# Patient Record
Sex: Female | Born: 1960 | Race: White | Hispanic: No | Marital: Married | Smoking: Never smoker
Health system: Southern US, Community
[De-identification: ages and names within clinical notes are randomized; demographics above are authoritative.]

## PROBLEM LIST (undated history)

## (undated) DIAGNOSIS — K769 Liver disease, unspecified: Secondary | ICD-10-CM

## (undated) DIAGNOSIS — E079 Disorder of thyroid, unspecified: Secondary | ICD-10-CM

## (undated) DIAGNOSIS — E559 Vitamin D deficiency, unspecified: Secondary | ICD-10-CM

## (undated) DIAGNOSIS — K219 Gastro-esophageal reflux disease without esophagitis: Secondary | ICD-10-CM

## (undated) DIAGNOSIS — G43909 Migraine, unspecified, not intractable, without status migrainosus: Secondary | ICD-10-CM

## (undated) DIAGNOSIS — K589 Irritable bowel syndrome without diarrhea: Secondary | ICD-10-CM

## (undated) DIAGNOSIS — Z148 Genetic carrier of other disease: Secondary | ICD-10-CM

## (undated) DIAGNOSIS — M3500 Sicca syndrome, unspecified: Secondary | ICD-10-CM

## (undated) HISTORY — PX: COLON SURGERY: SHX602

## (undated) HISTORY — DX: Genetic carrier of other disease: Z14.8

## (undated) HISTORY — PX: CHOLECYSTECTOMY: SHX55

## (undated) HISTORY — PX: CATARACT EXTRACTION: SUR2

## (undated) HISTORY — PX: LIVER BIOPSY: SHX301

## (undated) HISTORY — DX: Irritable bowel syndrome, unspecified: K58.9

## (undated) HISTORY — PX: ABDOMINAL HYSTERECTOMY: SHX81

## (undated) HISTORY — DX: Migraine, unspecified, not intractable, without status migrainosus: G43.909

## (undated) HISTORY — DX: Gastro-esophageal reflux disease without esophagitis: K21.9

## (undated) HISTORY — PX: BREAST BIOPSY: SHX20

## (undated) HISTORY — DX: Liver disease, unspecified: K76.9

## (undated) HISTORY — PX: OTHER SURGICAL HISTORY: SHX169

## (undated) HISTORY — DX: Sjogren syndrome, unspecified: M35.00

## (undated) HISTORY — DX: Disorder of thyroid, unspecified: E07.9

## (undated) HISTORY — DX: Vitamin D deficiency, unspecified: E55.9

---

## 2007-09-30 HISTORY — PX: BREAST BIOPSY: SHX20

## 2018-09-22 LAB — LAB REPORT - SCANNED: EGFR (Non-African Amer.): 71

## 2020-05-24 DIAGNOSIS — E785 Hyperlipidemia, unspecified: Secondary | ICD-10-CM | POA: Insufficient documentation

## 2020-05-24 DIAGNOSIS — M797 Fibromyalgia: Secondary | ICD-10-CM | POA: Insufficient documentation

## 2020-05-24 DIAGNOSIS — K219 Gastro-esophageal reflux disease without esophagitis: Secondary | ICD-10-CM | POA: Insufficient documentation

## 2020-05-24 DIAGNOSIS — Z148 Genetic carrier of other disease: Secondary | ICD-10-CM | POA: Insufficient documentation

## 2020-05-24 DIAGNOSIS — E063 Autoimmune thyroiditis: Secondary | ICD-10-CM | POA: Insufficient documentation

## 2020-05-24 DIAGNOSIS — G43909 Migraine, unspecified, not intractable, without status migrainosus: Secondary | ICD-10-CM | POA: Insufficient documentation

## 2020-05-24 DIAGNOSIS — K581 Irritable bowel syndrome with constipation: Secondary | ICD-10-CM | POA: Insufficient documentation

## 2020-05-24 DIAGNOSIS — E559 Vitamin D deficiency, unspecified: Secondary | ICD-10-CM | POA: Insufficient documentation

## 2020-05-24 DIAGNOSIS — K760 Fatty (change of) liver, not elsewhere classified: Secondary | ICD-10-CM | POA: Insufficient documentation

## 2020-05-24 DIAGNOSIS — M3500 Sicca syndrome, unspecified: Secondary | ICD-10-CM | POA: Insufficient documentation

## 2020-08-24 ENCOUNTER — Other Ambulatory Visit: Payer: Self-pay | Admitting: Internal Medicine

## 2020-08-24 DIAGNOSIS — Z1231 Encounter for screening mammogram for malignant neoplasm of breast: Secondary | ICD-10-CM

## 2020-09-04 LAB — COLOGUARD: COLOGUARD: NEGATIVE

## 2020-10-04 DIAGNOSIS — Z79899 Other long term (current) drug therapy: Secondary | ICD-10-CM | POA: Insufficient documentation

## 2020-10-20 ENCOUNTER — Ambulatory Visit
Admission: RE | Admit: 2020-10-20 | Discharge: 2020-10-20 | Disposition: A | Discharge status: Home / Self Care | Payer: BC Managed Care – PPO | Source: Ambulatory Visit | Attending: Internal Medicine | Admitting: Internal Medicine

## 2020-10-20 ENCOUNTER — Other Ambulatory Visit: Payer: Self-pay

## 2020-10-20 DIAGNOSIS — Z1231 Encounter for screening mammogram for malignant neoplasm of breast: Secondary | ICD-10-CM | POA: Diagnosis not present

## 2021-03-09 ENCOUNTER — Other Ambulatory Visit: Payer: Self-pay | Admitting: Internal Medicine

## 2021-03-09 ENCOUNTER — Other Ambulatory Visit (HOSPITAL_BASED_OUTPATIENT_CLINIC_OR_DEPARTMENT_OTHER): Payer: Self-pay | Admitting: Internal Medicine

## 2021-03-09 DIAGNOSIS — E042 Nontoxic multinodular goiter: Secondary | ICD-10-CM

## 2021-03-09 DIAGNOSIS — R221 Localized swelling, mass and lump, neck: Secondary | ICD-10-CM

## 2021-04-04 ENCOUNTER — Ambulatory Visit
Admission: RE | Admit: 2021-04-04 | Discharge: 2021-04-04 | Disposition: A | Discharge status: Home / Self Care | Payer: Self-pay | Source: Ambulatory Visit | Attending: Internal Medicine | Admitting: Internal Medicine

## 2021-04-04 ENCOUNTER — Ambulatory Visit
Admission: RE | Admit: 2021-04-04 | Discharge: 2021-04-04 | Disposition: A | Discharge status: Home / Self Care | Payer: BC Managed Care – PPO | Source: Ambulatory Visit | Attending: Internal Medicine | Admitting: Internal Medicine

## 2021-04-04 ENCOUNTER — Other Ambulatory Visit: Payer: Self-pay

## 2021-04-04 ENCOUNTER — Ambulatory Visit (INDEPENDENT_AMBULATORY_CARE_PROVIDER_SITE_OTHER): Payer: BC Managed Care – PPO | Admitting: Urology

## 2021-04-04 ENCOUNTER — Other Ambulatory Visit: Payer: Self-pay | Admitting: Internal Medicine

## 2021-04-04 ENCOUNTER — Encounter: Payer: Self-pay | Admitting: Urology

## 2021-04-04 VITALS — BP 130/65 | HR 79

## 2021-04-04 DIAGNOSIS — E042 Nontoxic multinodular goiter: Secondary | ICD-10-CM | POA: Diagnosis present

## 2021-04-04 DIAGNOSIS — R221 Localized swelling, mass and lump, neck: Secondary | ICD-10-CM | POA: Diagnosis present

## 2021-04-04 DIAGNOSIS — R32 Unspecified urinary incontinence: Secondary | ICD-10-CM | POA: Diagnosis not present

## 2021-04-04 DIAGNOSIS — E041 Nontoxic single thyroid nodule: Secondary | ICD-10-CM

## 2021-04-04 DIAGNOSIS — R350 Frequency of micturition: Secondary | ICD-10-CM

## 2021-04-04 LAB — MICROSCOPIC EXAMINATION
Bacteria, UA: NONE SEEN
RBC, Urine: NONE SEEN /hpf (ref 0–2)

## 2021-04-04 LAB — URINALYSIS, COMPLETE
Bilirubin, UA: NEGATIVE
Glucose, UA: NEGATIVE
Ketones, UA: NEGATIVE
Leukocytes,UA: NEGATIVE
Nitrite, UA: NEGATIVE
Protein,UA: NEGATIVE
RBC, UA: NEGATIVE
Specific Gravity, UA: 1.01 (ref 1.005–1.030)
Urobilinogen, Ur: 0.2 mg/dL (ref 0.2–1.0)
pH, UA: 5 (ref 5.0–7.5)

## 2021-04-04 LAB — POCT I-STAT CREATININE: Creatinine, Ser: 1 mg/dL (ref 0.44–1.00)

## 2021-04-04 MED ORDER — MIRABEGRON ER 50 MG PO TB24: 50.0000 mg | ORAL_TABLET | Freq: Every day | ORAL | 11 refills | Status: DC

## 2021-04-04 MED ORDER — IOHEXOL 300 MG/ML  SOLN
75.0000 mL | Freq: Once | INTRAMUSCULAR | Status: AC | PRN
  Administered 2021-04-04: 75 mL via INTRAVENOUS

## 2021-04-04 NOTE — Progress Notes (Signed)
04/04/2021 8:26 AM   Marinus Maw Sep 17, 1960 [id number]  Referring provider: Enid Baas, MD [address] [place],  [place] [address]  Chief Complaint  Patient presents with   Urinary Incontinence    HPI: I was consulted to assess the patient's urge and bladder.  She had 1 episode of urgency incontinence that she did not stop a few weeks ago.  She thinks she was more tired from her Sjogren's syndrome and fibromyalgia on that day.  She has rare to no stress incontinence.  No bedwetting.  Does not wear a pad  Voids every 2-3 hours gets up once at night  Prone to constipation with irritable bowel syndrome.  Has had a hysterectomy.  No history of kidney stones bladder surgery bladder infections.  No neurologic issues.  No treatment   PMH: No past medical history on file.  Surgical History:   Home Medications:  Allergies as of 04/04/2021       Reactions   Pregabalin    Other reaction(s): Headache   Hydroxychloroquine Sulfate Rash   Other Rash        Medication List    as of April 04, 2021  8:26 AM   You have not been prescribed any medications.     Allergies:  Allergies  Allergen Reactions   Pregabalin     Other reaction(s): Headache   Hydroxychloroquine Sulfate Rash   Other Rash    Family History: Family History  Problem Relation Age of Onset   Breast cancer Neg Hx     Social History:  reports that she has never smoked. She has never used smokeless tobacco. No history on file for alcohol use and drug use.  ROS:                                        Physical Exam: BP 130/65   Pulse 79   Constitutional:  Alert and oriented, No acute distress. HEENT: [place] AT, moist mucus membranes.  Trachea midline, no masses. Cardiovascular: No clubbing, cyanosis, or edema. Respiratory: Normal respiratory effort, no increased work of breathing. GI: Abdomen is soft, nontender, nondistended, no abdominal masses GU:  Supported bladder neck.  No prolapse or stress incontinence Skin: No rashes, bruises or suspicious lesions. Lymph: No cervical or inguinal adenopathy. Neurologic: Grossly intact, no focal deficits, moving all 4 extremities. Psychiatric: Normal mood and affect.  Laboratory Data: No results found for: WBC, HGB, HCT, MCV, PLT  No results found for: CREATININE  No results found for: PSA  No results found for: TESTOSTERONE  No results found for: HGBA1C  Urinalysis No results found for: COLORURINE, APPEARANCEUR, LABSPEC, PHURINE, GLUCOSEU, HGBUR, BILIRUBINUR, KETONESUR, PROTEINUR, UROBILINOGEN, NITRITE, LEUKOCYTESUR  Pertinent Imaging: Urine reviewed.  Urine sent for culture.  Chart reviewed.  Assessment & Plan: Patient has rare urgency incontinence with worsening urgency.  The role of medical behavioral therapy discussed.  If she took medication her bladder could be retrained and get off medication in some cases.  She states she did physical therapy years ago for bowel.  We talked briefly about her urgency suppression technique.  She like to try Myrbetriq samples and prescription.  If she does really well on the pill we may stop it in the future for her milder symptoms.  Many questions were answered      1. Urinary incontinence, unspecified type  - Urinalysis, Complete   No follow-ups  on file.  Reece Packer, MD  [facility] [address], [place], [place] [address] [phone]

## 2021-04-04 NOTE — Addendum Note (Signed)
Addended by: Milas Kocher A on: 04/04/2021 09:00 AM   Modules accepted: Orders

## 2021-04-07 LAB — CULTURE, URINE COMPREHENSIVE

## 2021-05-05 ENCOUNTER — Encounter: Payer: Self-pay | Admitting: Urology

## 2021-05-09 MED ORDER — OXYBUTYNIN CHLORIDE ER 10 MG PO TB24: 10.0000 mg | ORAL_TABLET | Freq: Every day | ORAL | 11 refills | Status: DC

## 2021-05-23 ENCOUNTER — Ambulatory Visit: Payer: BC Managed Care – PPO | Admitting: Urology

## 2021-06-20 ENCOUNTER — Ambulatory Visit (INDEPENDENT_AMBULATORY_CARE_PROVIDER_SITE_OTHER): Payer: BC Managed Care – PPO | Admitting: Urology

## 2021-06-20 ENCOUNTER — Other Ambulatory Visit: Payer: Self-pay

## 2021-06-20 VITALS — BP 123/76 | HR 72

## 2021-06-20 DIAGNOSIS — N3946 Mixed incontinence: Secondary | ICD-10-CM | POA: Diagnosis not present

## 2021-06-20 NOTE — Progress Notes (Signed)
06/20/2021 9:44 AM   Susan Nelson 11/03/1960 [id number]  Referring provider: Enid Baas, MD [address] [place],  [place] [address]  No chief complaint on file.   HPI: I was consulted to assess the patient's urgent bladder.  She had 1 episode of urgency incontinence that she did not stop a few weeks ago.  She thinks she was more tired from her Sjogren's syndrome and fibromyalgia on that day.  She has rare to no stress incontinence.  No bedwetting.  Does not wear a pad  Voids every 2-3 hours gets up once at night  Prone to constipation with irritable bowel syndrome.  Has had a hysterectomy.    Well supported bladder neck.  No prolapse or stress incontinence  Patient has rare urgency incontinence with worsening urgency.  The role of medical behavioral therapy discussed.  If she took medication her bladder could be retrained and get off medication in some cases.  She states she did physical therapy years ago for bowel.  We talked briefly about her urgency suppression technique.  She like to try Myrbetriq samples and prescription.  If she does really well on the pill we may stop it in the future for her milder symptoms.  Many questions were answered  Today Myrbetriq helped but was too expensive.  She switched to oxybutynin.  Doing very well.  Had 1 episode of stress incontinence small amount with a sneeze.  I explained the difference in types of her incontinence.  Clinically not infected.  Frequency stable      PMH: Past Medical History:  Diagnosis Date   Carrier of alpha-1-antitrypsin deficiency    GERD (gastroesophageal reflux disease)    IBS (irritable bowel syndrome)    Liver disease    Migraine    Sjogren's disease (HCC)    Thyroid disease    Vitamin D deficiency     Surgical History:   Home Medications:  Allergies as of 06/20/2021       Reactions   Pregabalin    Other reaction(s): Headache   Hydroxychloroquine Sulfate Rash   Other Rash         Medication List        Accurate as of June 20, 2021  9:44 AM. If you have any questions, ask your nurse or doctor.          ascorbic acid 1000 MG tablet Commonly known as: VITAMIN C Take by mouth.   azaTHIOprine 50 MG tablet Commonly known as: IMURAN Take 1 tablet by mouth 2 (two) times daily.   celecoxib 200 MG capsule Commonly known as: CELEBREX   cyanocobalamin 1000 MCG tablet Take by mouth.   DULoxetine 60 MG capsule Commonly known as: CYMBALTA Take by mouth.   ergocalciferol 1.25 MG (50000 UT) capsule Commonly known as: VITAMIN D2 Take by mouth.   estradiol 1 MG tablet Commonly known as: ESTRACE TAKE 1 TABLET BY MOUTH EVERY DAY NIGHTLY   Glucosamine-Chondroitin 1500-1200 MG/30ML Liqd Take by mouth.   levothyroxine 112 MCG tablet Commonly known as: SYNTHROID Take by mouth.   liothyronine 5 MCG tablet Commonly known as: CYTOMEL Take by mouth.   oxybutynin 10 MG 24 hr tablet Commonly known as: DITROPAN-XL Take 1 tablet (10 mg total) by mouth daily.   pantoprazole 40 MG tablet Commonly known as: PROTONIX Take 1 tablet by mouth daily.   rosuvastatin 10 MG tablet Commonly known as: CRESTOR   selenium 200 MCG Tabs tablet Take by mouth.   Vitamin E 268  MG (400 UNIT) Caps Take by mouth.        Allergies:  Allergies  Allergen Reactions   Pregabalin     Other reaction(s): Headache   Hydroxychloroquine Sulfate Rash   Other Rash    Family History: Family History  Problem Relation Age of Onset   Breast cancer Neg Hx     Social History:  reports that she has never smoked. She has never used smokeless tobacco. No history on file for alcohol use and drug use.  ROS:                                        Physical Exam: There were no vitals taken for this visit.  Constitutional:  Alert and oriented, No acute distress. HEENT: [place] AT, moist mucus membranes.  Trachea midline, no masses.   Laboratory  Data: No results found for: WBC, HGB, HCT, MCV, PLT  Lab Results  Component Value Date   CREATININE 1.00 04/04/2021    No results found for: PSA  No results found for: TESTOSTERONE  No results found for: HGBA1C  Urinalysis    Component Value Date/Time   APPEARANCEUR Clear 04/04/2021 0823   GLUCOSEU Negative 04/04/2021 0823   BILIRUBINUR Negative 04/04/2021 0823   PROTEINUR Negative 04/04/2021 0823   NITRITE Negative 04/04/2021 0823   LEUKOCYTESUR Negative 04/04/2021 0823    Pertinent Imaging:   Assessment & Plan: Patient has mild mixed incontinence and primarily overactive bladder.  Reassess in 6 months.  She will try to stop the medication about 2 weeks prior.  She understands that when she is tired from fibromyalgia she may be more likely to leak but they are not directly related  There are no diagnoses linked to this encounter.  No follow-ups on file.  Martina Sinner, MD  [facility] [address], [address] [place], [place] [address] [phone]

## 2021-10-04 ENCOUNTER — Other Ambulatory Visit: Payer: Self-pay | Admitting: Internal Medicine

## 2021-10-04 DIAGNOSIS — Z1231 Encounter for screening mammogram for malignant neoplasm of breast: Secondary | ICD-10-CM

## 2021-10-26 ENCOUNTER — Ambulatory Visit
Admission: RE | Admit: 2021-10-26 | Discharge: 2021-10-26 | Disposition: A | Discharge status: Home / Self Care | Payer: BC Managed Care – PPO | Source: Ambulatory Visit | Attending: Internal Medicine | Admitting: Internal Medicine

## 2021-10-26 DIAGNOSIS — Z1231 Encounter for screening mammogram for malignant neoplasm of breast: Secondary | ICD-10-CM | POA: Diagnosis present

## 2021-12-19 ENCOUNTER — Ambulatory Visit: Payer: BC Managed Care – PPO | Admitting: Urology

## 2022-02-06 ENCOUNTER — Ambulatory Visit (INDEPENDENT_AMBULATORY_CARE_PROVIDER_SITE_OTHER): Payer: BC Managed Care – PPO | Admitting: Urology

## 2022-02-06 VITALS — BP 123/79 | HR 76 | Wt 218.0 lb

## 2022-02-06 DIAGNOSIS — N3946 Mixed incontinence: Secondary | ICD-10-CM | POA: Diagnosis not present

## 2022-02-06 MED ORDER — OXYBUTYNIN CHLORIDE ER 10 MG PO TB24: 10.0000 mg | ORAL_TABLET | Freq: Every day | ORAL | 3 refills | Status: DC

## 2022-02-06 NOTE — Progress Notes (Signed)
02/06/2022 3:51 PM   Susan Nelson [birth year] [id number]  Referring provider: Enid Baas, MD [address] [place],  [place] [address]  Chief Complaint  Patient presents with   Urinary Incontinence   Follow-up    HPI: I was consulted to assess the patient's urgent bladder.  She had 1 episode of urgency incontinence that she did not stop a few weeks ago.  She thinks she was more tired from her Sjogren's syndrome and fibromyalgia on that day.  She has rare to no stress incontinence.  No bedwetting.  Does not wear a pad  Voids every 2-3 hours gets up once at night  Prone to constipation with irritable bowel syndrome.  Has had a hysterectomy.     Well supported bladder neck.  No prolapse or stress incontinence   Patient has rare urgency incontinence with worsening urgency.  The role of medical behavioral therapy discussed.  If she took medication her bladder could be retrained and get off medication in some cases.  She states she did physical therapy years ago for bowel.  We talked briefly about her urgency suppression technique.  She like to try Myrbetriq samples and prescription.  If she does really well on the pill we may stop it in the future for her milder symptoms.  Many questions were answered   Myrbetriq helped but was too expensive.  She switched to oxybutynin.  Doing very well.  Had 1 episode of stress incontinence small amount with a sneeze.  I explained the difference in types of her incontinence.     Patient has mild mixed incontinence and primarily overactive bladder.  Reassess in 6 months.  She will try to stop the medication about 2 weeks prior.  She understands that when she is tired from fibromyalgia she may be more likely to leak but they are not directly related  Today Patient doing well on oxybutynin.  She was looking after her mother in the ER for 2-1/2 months and did not want to stop the medication.  No infections.  Frequency  stable. Today    PMH: Past Medical History:  Diagnosis Date   Carrier of alpha-1-antitrypsin deficiency    GERD (gastroesophageal reflux disease)    IBS (irritable bowel syndrome)    Liver disease    Migraine    Sjogren's disease (HCC)    Thyroid disease    Vitamin D deficiency     Surgical History: Past Surgical History:  Procedure Laterality Date   ABDOMINAL HYSTERECTOMY     BREAST BIOPSY Right 09/2007   benign w/ clip per pt   breast biopsy     BREAST BIOPSY     breasy biopsy     CATARACT EXTRACTION     CESAREAN SECTION     CHOLECYSTECTOMY     COLON SURGERY     LIVER BIOPSY      Home Medications:  Allergies as of 02/06/2022       Reactions   Pregabalin    Other reaction(s): Headache   Hydroxychloroquine Sulfate Rash   Other Rash        Medication List        Accurate as of February 06, 2022  3:51 PM. If you have any questions, ask your nurse or doctor.          ascorbic acid 1000 MG tablet Commonly known as: VITAMIN C Take by mouth.   azaTHIOprine 50 MG tablet Commonly known as: IMURAN Take 1 tablet by mouth 2 (two) times  daily.   Biotene Dry Mouth Moisturizing Soln   celecoxib 200 MG capsule Commonly known as: CELEBREX   cyanocobalamin 1000 MCG tablet Take by mouth.   DULoxetine 60 MG capsule Commonly known as: CYMBALTA Take by mouth.   ergocalciferol 1.25 MG (50000 UT) capsule Commonly known as: VITAMIN D2 Take by mouth.   estradiol 1 MG tablet Commonly known as: ESTRACE TAKE 1 TABLET BY MOUTH EVERY DAY NIGHTLY   Glucosamine-Chondroitin 1500-1200 MG/30ML Liqd Take by mouth.   levothyroxine 112 MCG tablet Commonly known as: SYNTHROID Take by mouth.   liothyronine 5 MCG tablet Commonly known as: CYTOMEL Take by mouth.   oxybutynin 10 MG 24 hr tablet Commonly known as: DITROPAN-XL Take 1 tablet (10 mg total) by mouth daily.   pantoprazole 40 MG tablet Commonly known as: PROTONIX Take 1 tablet by mouth daily.    rosuvastatin 10 MG tablet Commonly known as: CRESTOR   selenium 200 MCG Tabs tablet Take by mouth.   Vitamin E 268 MG (400 UNIT) Caps Take by mouth.        Allergies:  Allergies  Allergen Reactions   Pregabalin     Other reaction(s): Headache   Hydroxychloroquine Sulfate Rash   Other Rash    Family History: Family History  Problem Relation Age of Onset   Breast cancer Neg Hx     Social History:  reports that she has never smoked. She has never used smokeless tobacco. No history on file for alcohol use and drug use.  ROS:                                        Physical Exam: BP 123/79   Pulse 76   Wt 98.9 kg   Constitutional:  Alert and oriented, No acute distress. HEENT: [place] AT, moist mucus membranes.  Trachea midline, no masses.   Laboratory Data: No results found for: "WBC", "HGB", "HCT", "MCV", "PLT"  Lab Results  Component Value Date   CREATININE 1.00 04/04/2021    No results found for: "PSA"  No results found for: "TESTOSTERONE"  No results found for: "HGBA1C"  Urinalysis    Component Value Date/Time   APPEARANCEUR Clear 04/04/2021 0823   GLUCOSEU Negative 04/04/2021 0823   BILIRUBINUR Negative 04/04/2021 0823   PROTEINUR Negative 04/04/2021 0823   NITRITE Negative 04/04/2021 0823   LEUKOCYTESUR Negative 04/04/2021 0823    Pertinent Imaging:   Assessment & Plan: 90x3 sent to pharmacy and I will see her in 1 year  1. Mixed incontinence  - Urinalysis, Complete   No follow-ups on file.  Reece Packer, MD  [facility] [address], [place], [place] [address] [phone]

## 2022-02-07 LAB — URINALYSIS, COMPLETE
Bilirubin, UA: NEGATIVE
Glucose, UA: NEGATIVE
Ketones, UA: NEGATIVE
Leukocytes,UA: NEGATIVE
Nitrite, UA: NEGATIVE
Protein,UA: NEGATIVE
RBC, UA: NEGATIVE
Specific Gravity, UA: 1.01 (ref 1.005–1.030)
Urobilinogen, Ur: 0.2 mg/dL (ref 0.2–1.0)
pH, UA: 5 (ref 5.0–7.5)

## 2022-02-07 LAB — MICROSCOPIC EXAMINATION

## 2022-06-27 IMAGING — MG MM DIGITAL SCREENING BILAT W/ TOMO AND CAD
8 series · 8 of 24 positions shown · non-contrast
Comparison: Previous exam(s).

CLINICAL DATA: Screening.

EXAM:
DIGITAL SCREENING BILATERAL MAMMOGRAM WITH TOMOSYNTHESIS AND CAD
TECHNIQUE: Bilateral screening digital craniocaudal and mediolateral oblique
mammograms were obtained. Bilateral screening digital breast
tomosynthesis was performed. The images were evaluated with
computer-aided detection.

[R CC synth-2D]
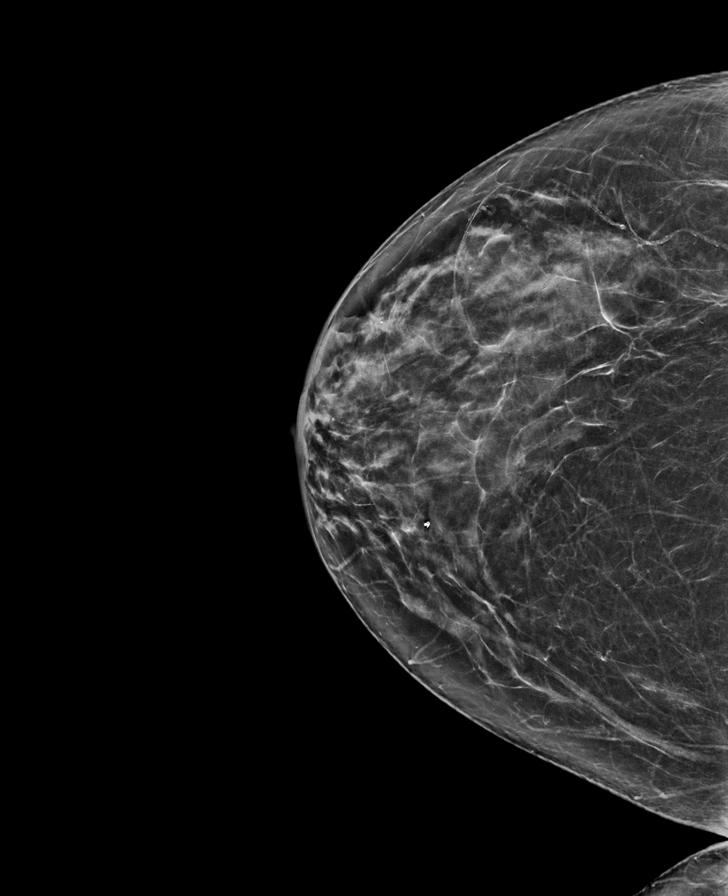

[L CC synth-2D]
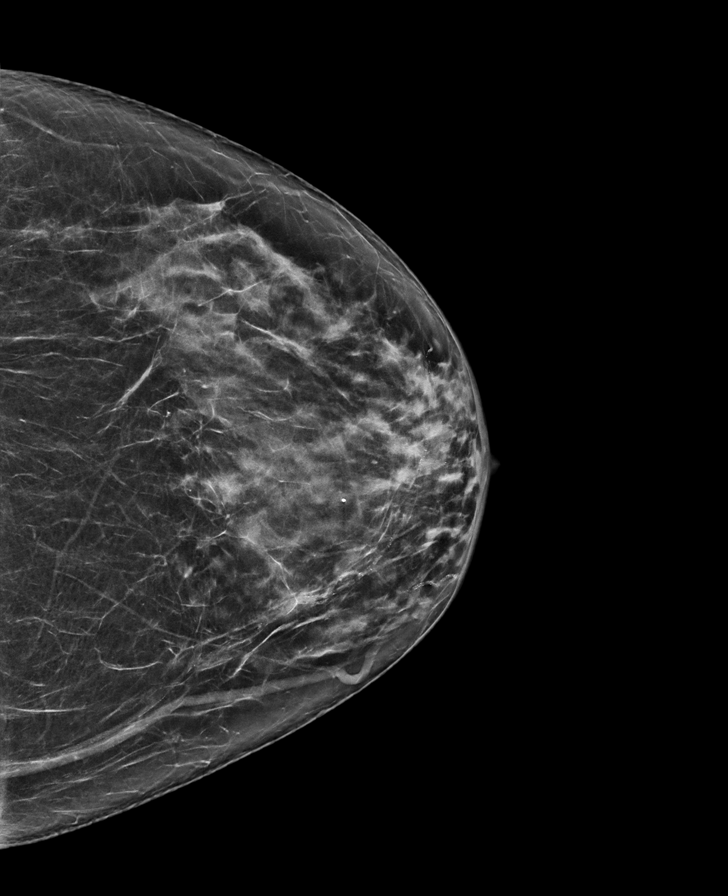

[R MLO synth-2D]
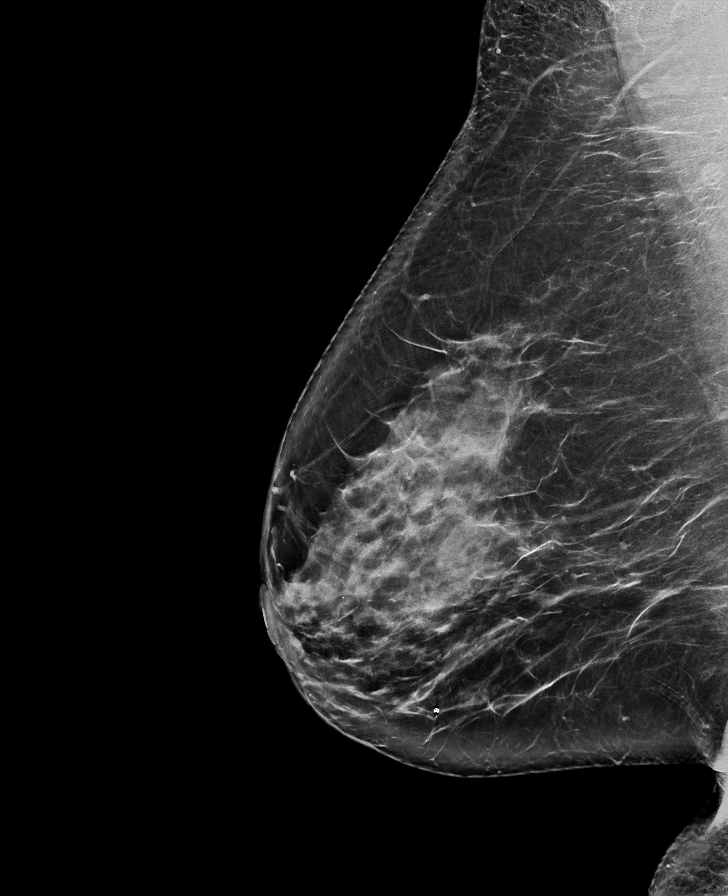

[L MLO synth-2D]
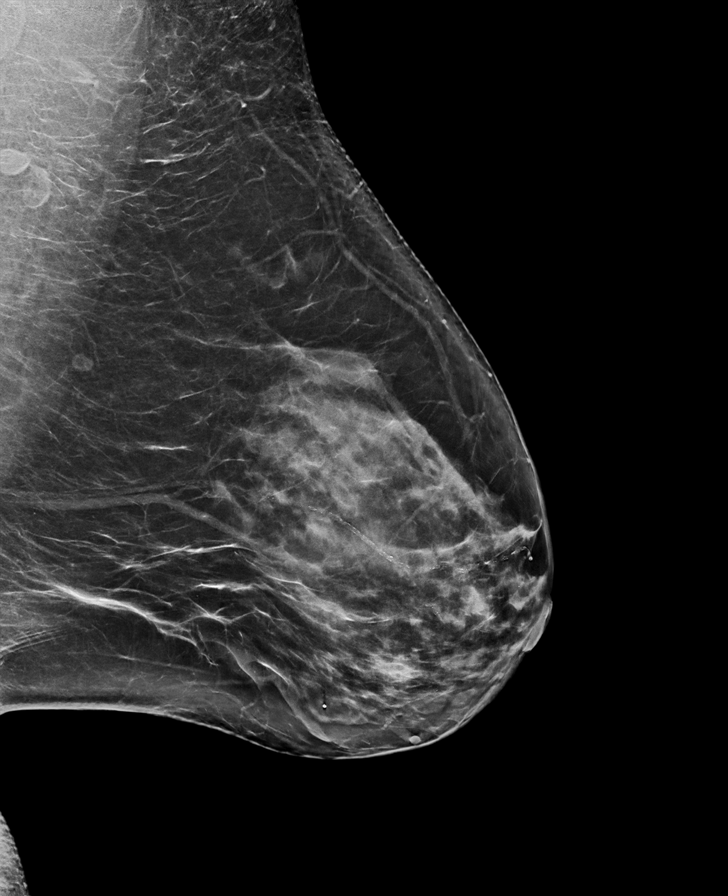

[R MLO tomo · tomo slice 45/89.0]
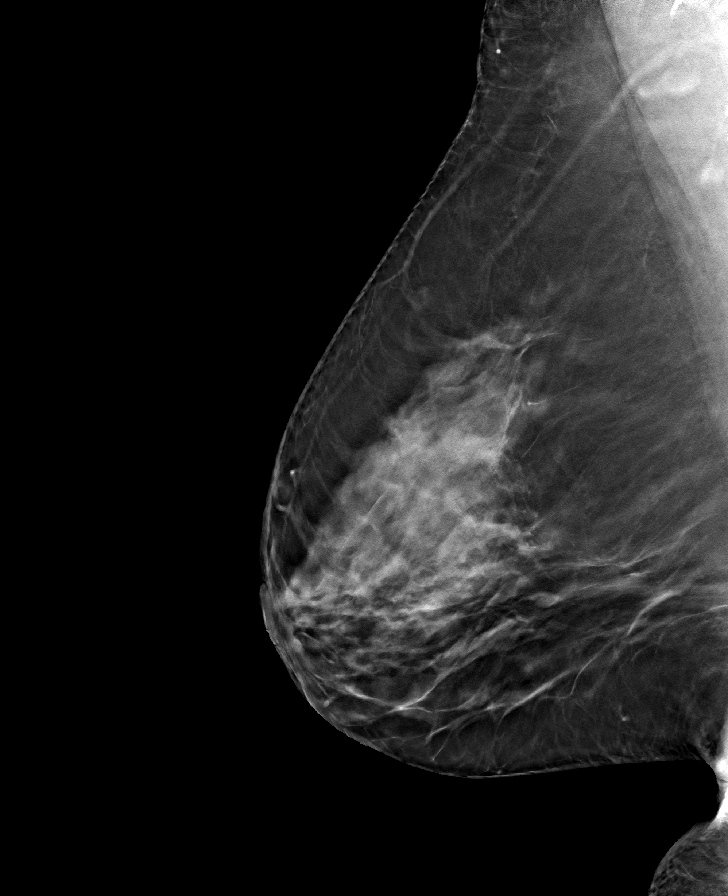

[R CC tomo · tomo slice 37/72.0]
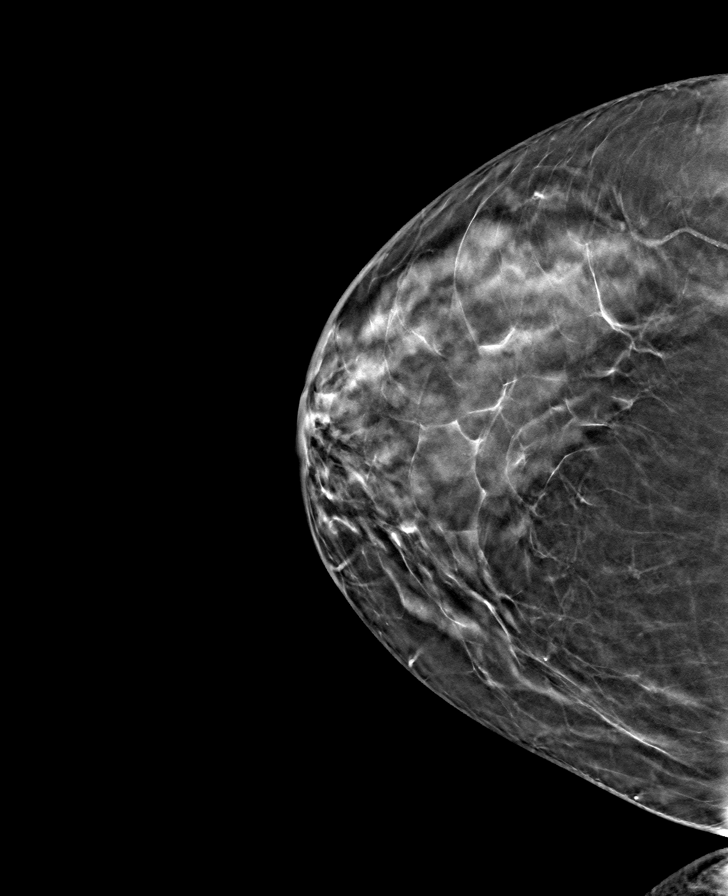

[L CC tomo · tomo slice 37/72.0]
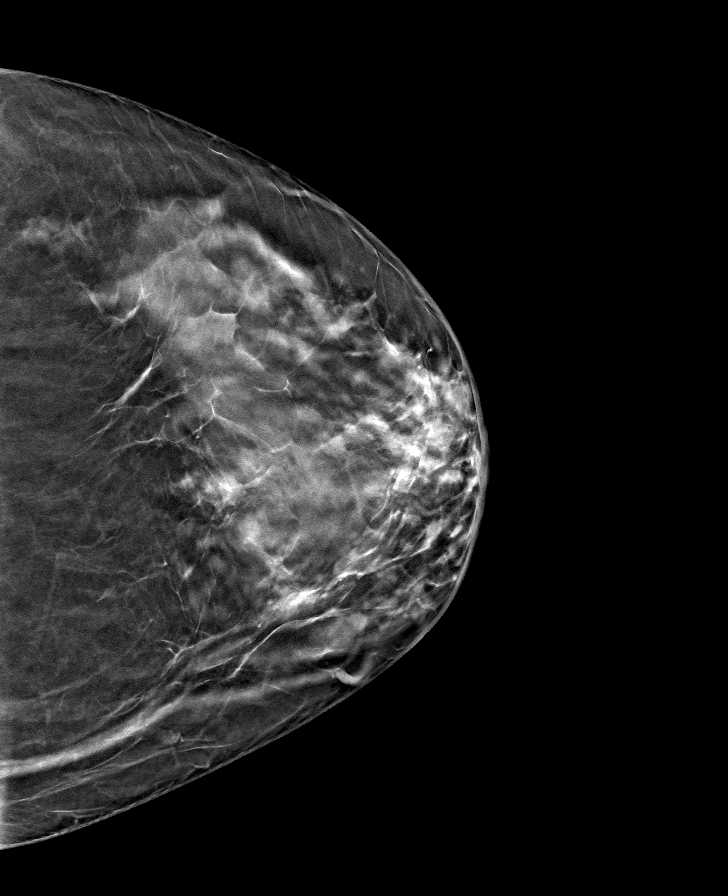

[L MLO tomo · tomo slice 44/87.0]
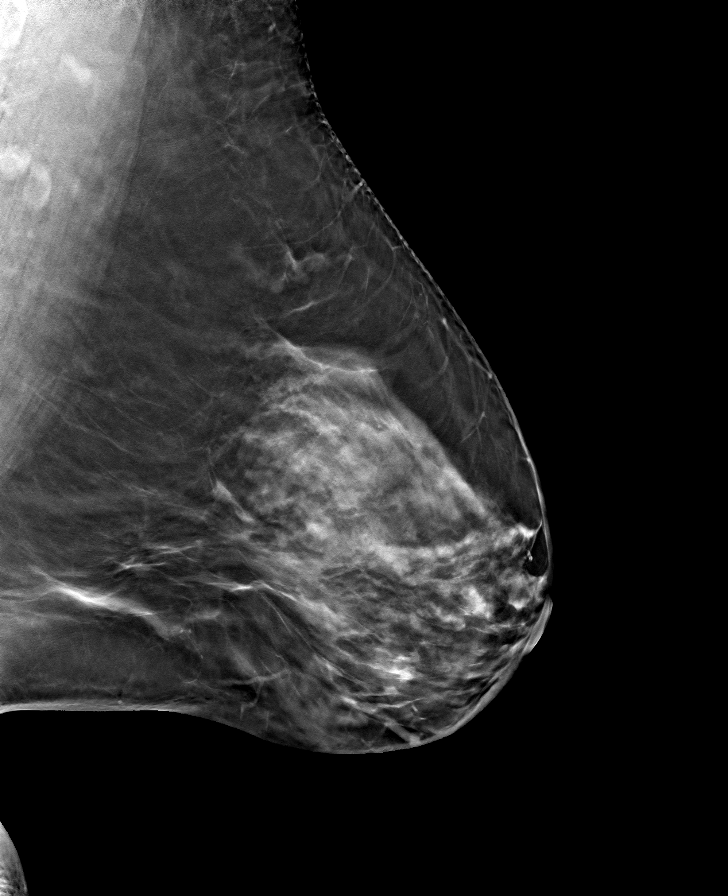

[8 of 24 positions shown; findings below may reference images not displayed]

ACR Breast Density Category c: The breast tissue is heterogeneously
dense, which may obscure small masses.
FINDINGS: There are no findings suspicious for malignancy.
IMPRESSION: No mammographic evidence of malignancy. A result letter of this
screening mammogram will be mailed directly to the patient.

RECOMMENDATION:
Screening mammogram in one year. (Code:Q3-W-BC3)

BI-RADS CATEGORY  1: Negative.

## 2022-08-16 ENCOUNTER — Other Ambulatory Visit: Payer: Self-pay | Admitting: Internal Medicine

## 2022-08-16 DIAGNOSIS — Z1231 Encounter for screening mammogram for malignant neoplasm of breast: Secondary | ICD-10-CM

## 2022-10-10 ENCOUNTER — Ambulatory Visit
Admission: RE | Admit: 2022-10-10 | Discharge: 2022-10-10 | Disposition: A | Discharge status: Home / Self Care | Payer: BC Managed Care – PPO | Source: Ambulatory Visit | Attending: Internal Medicine | Admitting: Internal Medicine

## 2022-10-10 DIAGNOSIS — Z1231 Encounter for screening mammogram for malignant neoplasm of breast: Secondary | ICD-10-CM | POA: Insufficient documentation

## 2022-10-12 ENCOUNTER — Other Ambulatory Visit: Payer: Self-pay | Admitting: Internal Medicine

## 2022-10-12 DIAGNOSIS — R921 Mammographic calcification found on diagnostic imaging of breast: Secondary | ICD-10-CM

## 2022-10-12 DIAGNOSIS — R928 Other abnormal and inconclusive findings on diagnostic imaging of breast: Secondary | ICD-10-CM

## 2022-10-20 ENCOUNTER — Ambulatory Visit
Admission: RE | Admit: 2022-10-20 | Discharge: 2022-10-20 | Disposition: A | Discharge status: Home / Self Care | Payer: BC Managed Care – PPO | Source: Ambulatory Visit | Attending: Internal Medicine | Admitting: Internal Medicine

## 2022-10-20 DIAGNOSIS — R921 Mammographic calcification found on diagnostic imaging of breast: Secondary | ICD-10-CM | POA: Diagnosis present

## 2022-10-20 DIAGNOSIS — R928 Other abnormal and inconclusive findings on diagnostic imaging of breast: Secondary | ICD-10-CM | POA: Diagnosis not present

## 2022-10-25 ENCOUNTER — Other Ambulatory Visit: Payer: Self-pay | Admitting: Internal Medicine

## 2022-10-25 DIAGNOSIS — R928 Other abnormal and inconclusive findings on diagnostic imaging of breast: Secondary | ICD-10-CM

## 2022-10-25 DIAGNOSIS — R921 Mammographic calcification found on diagnostic imaging of breast: Secondary | ICD-10-CM

## 2022-11-01 ENCOUNTER — Ambulatory Visit
Admission: RE | Admit: 2022-11-01 | Discharge: 2022-11-01 | Disposition: A | Discharge status: Home / Self Care | Payer: BC Managed Care – PPO | Source: Ambulatory Visit | Attending: Internal Medicine | Admitting: Internal Medicine

## 2022-11-01 DIAGNOSIS — R928 Other abnormal and inconclusive findings on diagnostic imaging of breast: Secondary | ICD-10-CM | POA: Insufficient documentation

## 2022-11-01 DIAGNOSIS — R921 Mammographic calcification found on diagnostic imaging of breast: Secondary | ICD-10-CM | POA: Insufficient documentation

## 2022-11-01 HISTORY — PX: BREAST BIOPSY: SHX20

## 2022-11-01 MED ORDER — LIDOCAINE-EPINEPHRINE 1 %-1:100000 IJ SOLN
20.0000 mL | Freq: Once | INTRAMUSCULAR | Status: AC
  Administered 2022-11-01: 20 mL
  Filled 2022-11-01: qty 20

## 2022-11-01 MED ORDER — LIDOCAINE 1 % OPTIME INJ - NO CHARGE
5.0000 mL | Freq: Once | INTRAMUSCULAR | Status: AC
  Administered 2022-11-01: 5 mL
  Filled 2022-11-01: qty 6

## 2023-02-05 ENCOUNTER — Encounter: Payer: Self-pay | Admitting: Urology

## 2023-02-05 ENCOUNTER — Ambulatory Visit (INDEPENDENT_AMBULATORY_CARE_PROVIDER_SITE_OTHER): Payer: BC Managed Care – PPO | Admitting: Urology

## 2023-02-05 VITALS — BP 143/83 | HR 69 | Ht 67.0 in | Wt 220.0 lb

## 2023-02-05 DIAGNOSIS — N3946 Mixed incontinence: Secondary | ICD-10-CM | POA: Diagnosis not present

## 2023-02-05 MED ORDER — OXYBUTYNIN CHLORIDE ER 10 MG PO TB24: 10.0000 mg | ORAL_TABLET | Freq: Every day | ORAL | 3 refills | Status: AC

## 2023-02-05 NOTE — Progress Notes (Signed)
02/05/2023 3:07 PM   Susan Nelson 04-16-1961 [id number]  Referring provider: Enid Baas, MD [address] [place] [address] [place],  [place] [address]  Chief Complaint  Patient presents with   Follow-up   Urinary Incontinence    HPI: I was consulted to assess the patient's urgent bladder.  She had 1 episode of urgency incontinence that she did not stop a few weeks ago.  She thinks she was more tired from her Sjogren's syndrome and fibromyalgia on that day.  She has rare to no stress incontinence.  No bedwetting.  Does not wear a pad  Voids every 2-3 hours gets up once at night  Prone to constipation with irritable bowel syndrome.  Has had a hysterectomy.     Well supported bladder neck.  No prolapse or stress incontinence   Patient has rare urgency incontinence with worsening urgency.  The role of medical behavioral therapy discussed.  If she took medication her bladder could be retrained and get off medication in some cases.  She states she did physical therapy years ago for bowel.  We talked briefly about her urgency suppression technique.  She like to try Myrbetriq samples and prescription.  If she does really well on the pill we may stop it in the future for her milder symptoms.  Many questions were answered   Myrbetriq helped but was too expensive.  She switched to oxybutynin.  Doing very well.  Had 1 episode of stress incontinence small amount with a sneeze.  I explained the difference in types of her incontinence.      Patient has mild mixed incontinence and primarily overactive bladder.  Reassess in 6 months.  She will try to stop the medication about 2 weeks prior.  She understands that when she is tired from fibromyalgia she may be more likely to leak but they are not directly related  Patient doing well on oxybutynin.  She was looking after her mother in the [place] for 2-1/2 months and did not want to stop the medication.  No infections.  Frequency stable.  Today    Urgency incontinence dramatically better on oxybutynin once a day.  No infections.  Frequency stable.  PMH: Past Medical History:  Diagnosis Date   Carrier of alpha-1-antitrypsin deficiency    GERD (gastroesophageal reflux disease)    IBS (irritable bowel syndrome)    Liver disease    Migraine    Sjogren's disease (HCC)    Thyroid disease    Vitamin D deficiency     Surgical History: Past Surgical History:  Procedure Laterality Date   ABDOMINAL HYSTERECTOMY     BREAST BIOPSY Right 09/2007   benign w/ clip per pt   BREAST BIOPSY Right 11/01/2022   Stereo Bx, X-clip, path pending   BREAST BIOPSY Right 11/01/2022   MM RT BREAST BX W LOC DEV 1ST LESION IMAGE BX SPEC STEREO GUIDE 11/01/2022 [facility]   breasy biopsy     CATARACT EXTRACTION     CESAREAN SECTION     CHOLECYSTECTOMY     COLON SURGERY     LIVER BIOPSY      Home Medications:  Allergies as of 02/05/2023       Reactions   Pregabalin    Other reaction(s): Headache   Hydroxychloroquine Sulfate Rash   Other Rash        Medication List        Accurate as of February 05, 2023  3:07 PM. If you have any questions, ask your nurse  or doctor.          ascorbic acid 1000 MG tablet Commonly known as: VITAMIN C Take by mouth.   azaTHIOprine 50 MG tablet Commonly known as: IMURAN Take 1 tablet by mouth 2 (two) times daily.   Biotene Dry Mouth Moisturizing Soln   celecoxib 200 MG capsule Commonly known as: CELEBREX   cyanocobalamin 1000 MCG tablet Take by mouth.   DULoxetine 60 MG capsule Commonly known as: CYMBALTA Take by mouth.   ergocalciferol 1.25 MG (50000 UT) capsule Commonly known as: VITAMIN D2 Take by mouth.   estradiol 1 MG tablet Commonly known as: ESTRACE TAKE 1 TABLET BY MOUTH EVERY DAY NIGHTLY   Glucosamine-Chondroitin 1500-1200 MG/30ML Liqd Take by mouth.   levothyroxine 112 MCG tablet Commonly known as: SYNTHROID Take by mouth.   liothyronine 5 MCG tablet Commonly  known as: CYTOMEL Take by mouth.   oxybutynin 10 MG 24 hr tablet Commonly known as: DITROPAN-XL Take 1 tablet (10 mg total) by mouth daily.   pantoprazole 40 MG tablet Commonly known as: PROTONIX Take 1 tablet by mouth daily.   rosuvastatin 10 MG tablet Commonly known as: CRESTOR   selenium 200 MCG Tabs tablet Take by mouth.   Vitamin E 268 MG (400 UNIT) Caps Take by mouth.        Allergies:  Allergies  Allergen Reactions   Pregabalin     Other reaction(s): Headache   Hydroxychloroquine Sulfate Rash   Other Rash    Family History: Family History  Problem Relation Age of Onset   Breast cancer Neg Hx     Social History:  reports that she has never smoked. She has never been exposed to tobacco smoke. She has never used smokeless tobacco. No history on file for alcohol use and drug use.  ROS:                                        Physical Exam: BP (!) 143/83   Pulse 69   Ht 5\' 7"  (1.702 m)   Wt 99.8 kg   BMI 34.46 kg/m   Constitutional:  Alert and oriented, No acute distress. HEENT: [place] AT, moist mucus membranes.  Trachea midline, no masses.   Laboratory Data: No results found for: "WBC", "HGB", "HCT", "MCV", "PLT"  Lab Results  Component Value Date   CREATININE 1.00 04/04/2021    No results found for: "PSA"  No results found for: "TESTOSTERONE"  No results found for: "HGBA1C"  Urinalysis    Component Value Date/Time   APPEARANCEUR Clear 02/06/2022 1530   GLUCOSEU Negative 02/06/2022 1530   BILIRUBINUR Negative 02/06/2022 1530   PROTEINUR Negative 02/06/2022 1530   NITRITE Negative 02/06/2022 1530   LEUKOCYTESUR Negative 02/06/2022 1530    Pertinent Imaging: Urine reviewed.   Assessment & Plan: Oxybutynin 90 x 3 sent to pharmacy and I will see in 1 year  1. Mixed incontinence  - Urinalysis, Complete   No follow-ups on file.  Martina Sinner, MD  [facility] [address],  [address] [place], [place] [address] [phone]

## 2023-02-05 NOTE — Addendum Note (Signed)
Addended by: Sueanne Margarita on: 02/05/2023 03:27 PM   Modules accepted: Orders

## 2023-02-06 LAB — URINALYSIS, COMPLETE
Bilirubin, UA: NEGATIVE
Glucose, UA: NEGATIVE
Ketones, UA: NEGATIVE
Leukocytes,UA: NEGATIVE
Nitrite, UA: NEGATIVE
Protein,UA: NEGATIVE
RBC, UA: NEGATIVE
Specific Gravity, UA: 1.025 (ref 1.005–1.030)
Urobilinogen, Ur: 0.2 mg/dL (ref 0.2–1.0)
pH, UA: 5.5 (ref 5.0–7.5)

## 2023-02-06 LAB — MICROSCOPIC EXAMINATION

## 2023-08-27 ENCOUNTER — Other Ambulatory Visit: Payer: Self-pay | Admitting: Internal Medicine

## 2023-08-27 DIAGNOSIS — Z1231 Encounter for screening mammogram for malignant neoplasm of breast: Secondary | ICD-10-CM

## 2023-10-09 ENCOUNTER — Ambulatory Visit
Admission: RE | Admit: 2023-10-09 | Discharge: 2023-10-09 | Disposition: A | Discharge status: Home / Self Care | Source: Ambulatory Visit | Attending: Internal Medicine | Admitting: Internal Medicine

## 2023-10-09 DIAGNOSIS — Z1231 Encounter for screening mammogram for malignant neoplasm of breast: Secondary | ICD-10-CM | POA: Diagnosis present

## 2023-10-09 NOTE — Progress Notes (Signed)
 Chief Complaint:  Annual Physical   Chief Complaint  Patient presents with  . Annual Exam    Patient presenting for annual exam - physical    Subjective:   Susan Nelson is a 63 y.o. female in today for annual physical.  History of Present Illness Susan Nelson is a 63 year old female who presents for a routine physical exam  She is here with her partner.  Has been working on through her diet and lifestyle changes and lost a few pounds since last visit.  She has switched over to gluten-free foods.  Also reports decreased appetite.  Follows with rheumatology for Sjogren's, fibromyalgia.  Has been stable on duloxetine, Celebrex, Imuran at this time.  Was seeing urology for history of overactive bladder and has been oxybutynin .  The Myrbetriq  worked better for her, it was not covered by her insurance.  Chronic history of IBS with constipation.  In the past Linzess resulted in diarrhea and abdominal cramps.  She remembers she did try Amitiza but not sure if it has helped or not.  Open to trying other medications to regularize her bowel movements.  Recent lab results showed a faint band of abnormal protein in the lambda region, identified as a free monoclonal protein.  Follow-up labs are pending.  She has a family history of breast cancer, as her older sister recently underwent a mastectomy after finding a lump. She is scheduled for a mammogram later today.  She has a history of thyroid issues and is due for a thyroid function test, as it has been six months since her last check.  Follows with endocrinology.  No chest pain. She reports occasional difficulty with pills getting stuck in her throat, which she attributes to Sjogren's syndrome.   Current Outpatient Medications  Medication Sig Dispense Refill  . azaTHIOprine (IMURAN) 50 mg tablet Take 1 tablet (50 mg total) by mouth 2 (two) times daily 180 tablet 1  . calcium-magnesium-zinc 333-133-5 mg Tab Take by mouth    . celecoxib  (CELEBREX) 200 MG capsule Take 1 capsule (200 mg total) by mouth 2 (two) times daily 180 capsule 1  . cyanocobalamin (VITAMIN B12) 1000 MCG tablet Take 1,000 mcg by mouth once daily    . cycloSPORINE (RESTASIS) 0.05 % ophthalmic emulsion Place 1 drop into both eyes 2 (two) times daily    . DULoxetine (CYMBALTA) 60 MG DR capsule Take 1 capsule (60 mg total) by mouth at bedtime 90 capsule 1  . ergocalciferol, vitamin D2, 1,250 mcg (50,000 unit) capsule TAKE 1 CAPSULE (50,000 UNITS TOTAL) BY MOUTH ONCE A WEEK ONCE ON MONDAYS 12 capsule 1  . estradioL (ESTRACE) 1 MG tablet TAKE 1 TABLET BY MOUTH EVERY DAY NIGHTLY 90 tablet 1  . glucosamine/chondr su A sod (GLUCOSAMINE-CHONDROITIN) 1,500-1,200 mg/30 mL Liqd Take by mouth 2 (two) times daily    . levothyroxine (SYNTHROID) 112 MCG tablet TAKE 1 TABLET BY MOUTH ONCE DAILY MONDAY THROUGH SATURDAY. 1/2 A TAB EACH WEEK ON SUNDAY. (Patient taking differently: Take 112 mcg by mouth once daily) 90 tablet 1  . liothyronine (CYTOMEL) 5 MCG tablet TAKE 1 TABLET BY MOUTH EVERY DAY 90 tablet 1  . omega-3s/dha/epa/fish oil (OMEGA 3 ORAL) Take 1,200 mg by mouth once daily Omega 3 with EPA and DHA    . oxyBUTYnin  (DITROPAN -XL) 10 MG XL tablet Take 1 tablet (10 mg total) by mouth once daily 90 tablet 1  . pantoprazole (PROTONIX) 40 MG DR tablet TAKE 1 TABLET BY MOUTH  EVERY DAY 90 tablet 1  . rizatriptan (MAXALT) 10 MG tablet TAKE 1 TABLET BY MOUTH ONCE AS NEEDED FOR MIGRAINE. MAY TAKE ANOTHER TABLET AFTER 2 HOURS IF NEEDED 9 tablet 2  . rosuvastatin (CRESTOR) 10 MG tablet TAKE 1 TABLET BY MOUTH EVERY DAY AT NIGHT 90 tablet 1  . selenium 200 mcg tablet Take 200 mcg by mouth once daily    . vitamin E 400 UNIT capsule Take 400 Units by mouth once daily    . prucalopride (MOTEGRITY) 2 mg tablet Take 1 tablet (2 mg total) by mouth once daily 30 tablet 2   No current facility-administered medications for this visit.    Allergies as of 10/09/2023 - Reviewed 10/09/2023   Allergen Reaction Noted  . Adhesive tape-silicones Rash 05/24/2020  . Hydroxychloroquine Rash 05/24/2020  . Lyrica [pregabalin] Headache 05/24/2020    Past Medical History:  Diagnosis Date  . Arthritis   . Carrier of alpha-1-antitrypsin deficiency   . Fibromyalgia   . GERD (gastroesophageal reflux disease)   . Hashimoto's thyroiditis    seronegative  . Hyperlipidemia   . Irritable bowel syndrome with constipation   . Migraine   . Non-alcoholic fatty liver disease   . Sjogrens syndrome (CMS/HHS-HCC)    seronegative  . Vitamin D deficiency     Past Surgical History:  Procedure Laterality Date  . INCISIONAL BIOPSY BREAST Right 09/2007  . ankle spiral fracture  09/2013  . LIVER BIOPSY  03/2017  . CATARACT EXTRACTION    . CESAREAN SECTION    . CHOLECYSTECTOMY    . FRACTURE SURGERY    . HYSTERECTOMY VAGINAL Bilateral 10/2008 & 12/2008     Family History  Problem Relation Name Age of Onset  . Diabetes type II Mother Harvest   . Glaucoma Mother Harvest   . High blood pressure (Hypertension) Mother Harvest        High family presence  . Obesity Mother Harvest   . Skin cancer Mother Harvest   . Stroke Mother Harvest   . Osteoarthritis Mother Harvest   . High blood pressure (Hypertension) Father Vinie        Deceased  . Bipolar disorder Daughter Annabella   . Colon polyps Paternal Wylie Brunner   . Hip fracture Sister Adrien   . High blood pressure (Hypertension) Sister Adrien   . Obesity Sister Adrien   . Osteoarthritis Sister Adrien   . Osteoporosis (Thinning of bones) Sister Adrien   . High blood pressure (Hypertension) Brother Marcey   . Obesity Brother Marcey   . Thyroid disease Brother Marcey   . Skin cancer Brother Marcey   . Stroke Maternal Aunt Charlene     Social History:  reports that she has never smoked. She has never been exposed to tobacco smoke. She has never used smokeless tobacco. She reports current alcohol use. She reports that she does not use  drugs.   Goals Addressed             This Visit's Progress   . Follow my doctor's care plan          Results for orders placed or performed in visit on 08/09/23  CBC w/auto Differential (5 Part)  Result Value Ref Range   WBC (White Blood Cell Count) 5.1 4.1 - 10.2 10^3/uL   RBC (Red Blood Cell Count) 4.76 4.04 - 5.48 10^6/uL   Hemoglobin 15.0 12.0 - 15.0 gm/dL   Hematocrit 56.3 64.9 - 47.0 %   MCV (Mean Corpuscular Volume)  91.6 80.0 - 100.0 fl   MCH (Mean Corpuscular Hemoglobin) 31.5 (H) 27.0 - 31.2 pg   MCHC (Mean Corpuscular Hemoglobin Concentration) 34.4 32.0 - 36.0 gm/dL   Platelet Count 735 849 - 450 10^3/uL   RDW-CV (Red Cell Distribution Width) 13.2 11.6 - 14.8 %   MPV (Mean Platelet Volume) 9.4 9.4 - 12.4 fl   Neutrophils 3.39 1.50 - 7.80 10^3/uL   Lymphocytes 1.01 1.00 - 3.60 10^3/uL   Monocytes 0.50 0.00 - 1.50 10^3/uL   Eosinophils 0.09 0.00 - 0.55 10^3/uL   Basophils 0.05 0.00 - 0.09 10^3/uL   Neutrophil % 67.1 32.0 - 70.0 %   Lymphocyte % 20.0 10.0 - 50.0 %   Monocyte % 9.9 4.0 - 13.0 %   Eosinophil % 1.8 1.0 - 5.0 %   Basophil% 1.0 0.0 - 2.0 %   Immature Granulocyte % 0.2 <=0.7 %   Immature Granulocyte Count 0.01 <=0.06 10^3/L  Comprehensive Metabolic Panel (CMP)  Result Value Ref Range   Glucose 101 70 - 110 mg/dL   Sodium 858 863 - 854 mmol/L   Potassium 4.2 3.6 - 5.1 mmol/L   Chloride 107 97 - 109 mmol/L   Carbon Dioxide (CO2) 28.8 22.0 - 32.0 mmol/L   Urea Nitrogen (BUN) 17 7 - 25 mg/dL   Creatinine 0.9 0.6 - 1.1 mg/dL   Glomerular Filtration Rate (eGFR) 72 >60 mL/min/1.73sq m   Calcium 9.6 8.7 - 10.3 mg/dL   AST  20 8 - 39 U/L   ALT  19 5 - 38 U/L   Alk Phos (alkaline Phosphatase) 67 34 - 104 U/L   Albumin 4.4 3.5 - 4.8 g/dL   Bilirubin, Total 0.5 0.3 - 1.2 mg/dL   Protein, Total 6.4 6.1 - 7.9 g/dL   A/G Ratio 2.2 1.0 - 5.0 gm/dL  Urinalysis w/Microscopic  Result Value Ref Range   Color Colorless Colorless, Straw, Light Yellow, Yellow,  Dark Yellow   Clarity Clear Clear   Specific Gravity 1.004 (L) 1.005 - 1.030   pH, Urine 5.5 5.0 - 8.0   Protein, Urinalysis Negative Negative mg/dL   Glucose, Urinalysis Negative Negative mg/dL   Ketones, Urinalysis Negative Negative mg/dL   Blood, Urinalysis Negative Negative   Nitrite, Urinalysis Negative Negative   Leukocyte Esterase, Urinalysis Negative Negative   Bilirubin, Urinalysis Negative Negative   Urobilinogen, Urinalysis 0.2 0.2 - 1.0 mg/dL   WBC, UA <1 <=5 /hpf   Red Blood Cells, Urinalysis <1 <=3 /hpf   Bacteria, Urinalysis 0-5 0 - 5 /hpf   Squamous Epithelial Cells, Urinalysis 0 /hpf  Sedimentation Rate-Automated  Result Value Ref Range   Sedimentation Rate-Automated 4 0 - 30 mm/hr  Complement C3, Serum - [facility]  Result Value Ref Range   Complement C3, Serum - [facility] 119 82 - 167 mg/dL   Narrative   Performed at:  [address] [place], [place]  [id number] Lab Director: Frankey Sas MD, Phone:  [phone]  Complement C4, Serum - [facility]  Result Value Ref Range   Complement C4, Serum - [facility] 19 12 - 38 mg/dL   Narrative   Performed at:  [address], [place], [place]  [id number] Lab Director: Frankey Sas MD, Phone:  [phone]  Prot+CreatU (Random) - [facility]  Result Value Ref Range   Creatinine, Urine - [facility] 22.8 Not Estab. mg/dL   Protein, Ur - [facility] <4.0 Not Estab. mg/dL   Protein/Creat Ratio - [facility] Comment (!) 0 -  200 mg/g creat   Narrative   Performed at:  [address] [facility] [address], [place], [place]  [id number] Lab Director: Frankey Sas MD, Phone:  [phone]  IFE and PE, Serum - [facility]  Result Value Ref Range   Total IgG - [facility] 669 586 - 1602 mg/dL   Immunoglobulin A, Qn, Serum - [facility] 198 87 - 352 mg/dL   IgM - [facility] 62 26 - 217 mg/dL   Protein Total - [facility] 6.3 6.0 - 8.5 g/dL   Albumin - [facility] 3.9 2.9 - 4.4 g/dL   Joeyj-8-Honalopw - [facility] 0.2 0.0 -  0.4 g/dL   Joeyj-7-Honalopw - [facility] 0.6 0.4 - 1.0 g/dL   Beta Globulin - [facility] 1.0 0.7 - 1.3 g/dL   Gamma Globulin - [facility] 0.5 0.4 - 1.8 g/dL   M-Spike - [facility] Not Observed Not Observed g/dL   Globulin, Total - [facility] 2.4 2.2 - 3.9 g/dL   A/G Ratio - [facility] 1.7 0.7 - 1.7   IFE 1 - [facility] Comment (!)    Please note: - [facility] Comment    Narrative   Performed at:  [facility] [address] [place], [place]  [id number] Lab Director: Frankey Sas MD, Phone:  [phone]  Anti-dsDNA Antibodies - [facility]  Result Value Ref Range   Anti-DNA (DS) Ab Qn - [facility] <1 0 - 9 IU/mL   Narrative   Performed at:  [facility] [address] [place], [place]  [id number] Lab Director: Frankey Sas MD, Phone:  [phone]  Creatine Kinase (CK), Total  Result Value Ref Range   CK, Total (Creatine Kinase, Total) 139 38 - 234 U/L      ROS:  General: No fever, chills or recent illness. No change in weight Skin:   No skin lesions, growths, masses, rashes, pruritus  HEENT: No change in vision or hearing. No pain or difficulty with swallowing.  Occasional dysphagia especially with medications. Respiratory: No cough or shortness of breath CV:  No chest pain or palpitations GI:  No pain, dyspepsia or change in bowel habits GU:  No dysuria, frequency, or hesitancy MSK:  No joint pain or injury, pains in her hand joints. Neurological: No headaches, changes in mental status, loss of sensation or strength  Endocrine:  No heat or cold intolerance, polydipsia, polyuria Psychological: Denies insomnia or depression symptoms  Objective:   Body mass index is 32.92 kg/m.  BP 118/78   Pulse 63   Ht 170.2 cm (5' 7)   Wt 95.3 kg (210 lb 3.2 oz)   SpO2 96%   BMI 32.92 kg/m   General: WD/WN female, in no acute distress HEENT: Pupils equal and round, EOMI.  OP moist without lesions Neck: supple, trachea midline; no thyromegaly Chest: normal to palpation Lungs:clear to  auscultation without wheeze or retraction Cardiac:  Regular rate and rhythm without murmur, gallops, or rubs Vascular: No carotid bruit and radials 2+; distal pulses 2+ Abdominal:soft, nontender, positive bowel sounds.  No guarding or rebound tenderness Extremities:  No clubbing, cyanosis or edema Neuro: CN grossly intact.  Gait intact.  No acute motor or sensory deficits Dermatologic: no significant rashes or nodules Lymph: no cervical or supraclavicular lymphadenopathy   Assessment/Plan:   Annual physical exam  (primary encounter diagnosis) Depression screening (Z13.31) Fibromyalgia Mixed hyperlipidemia Hashimoto's thyroiditis Carrier of alpha-1-antitrypsin deficiency Gastroesophageal reflux disease without esophagitis Screening for colon cancer  Assessment & Plan   Annual physical- -Addressed current concerns -Discussed past history and reviewed  old records -labs reviewed and new labs ordered -Discussed diet activity and living situation -Addressed health maintenance -Immunizations reviewed-patient is due for shingles, pneumonia and RSV vaccines. -Mammogram due-does have an appointment coming up later today. -Colonoscopy and colon cancer screening discussed-patient opted for Cologuard again.  Last colonoscopy in 2011 was completely normal.  2.  Abnormal lab protein test- - Ordered by rheumatology - Protein electrophoresis showing faint band of abnormal protein in lambda region - Consistent with free monoclonal protein - Labs to be repeated again and if confirmed, will be referred to hematology.  Asymptomatic and all other labs are within normal limits.  3. Irritable bowel syndrome with constipation (IBS-C) - She experiences difficulty regulating bowel movements, consistent with IBS-C.  - Linzess previously caused diarrhea, and Amitiza was ineffective. She is interested in alternative treatments.  - Motegrity is proposed, with Linzess as a backup if Motegrity is not  approved or effective. - Prescribe Motegrity 2 mg daily - Advise her to check insurance coverage for medications  4.  Reactive bladder and urinary incontinence - She uses oxybutynin  for urinary incontinence but reports better efficacy with Myrbetriq , which is not covered by insurance. She is interested in exploring other options. Gemtesa, similar to Myrbetriq , is considered if insurance allows. - Continue oxybutynin  - Check insurance coverage for Myrbetriq  and Gemtesa - Consider switching to Myrbetriq  or Gemtesa if covered by insurance  5.   Hypothyroidism- -currently following with endocrinology.  But once labs are stable, just wants to follow-up with PCP - Currently on Synthroid and Cytomel and labs pending today.  6.  Sjogren's- - Following with rheumatology - Stable on Imuran, duloxetine for pain for fibromyalgia.      Follow-up with me in 6 months for follow-up of bladder changes, IBS with constipation, Sjogren's and hypothyroidism.     LAVENIA BEAVER, MD  Portions of this note were created using dictation software and may contain typographical errors.

## 2023-10-17 ENCOUNTER — Other Ambulatory Visit: Payer: Self-pay | Admitting: Family Medicine

## 2023-10-17 DIAGNOSIS — R928 Other abnormal and inconclusive findings on diagnostic imaging of breast: Secondary | ICD-10-CM

## 2023-10-18 ENCOUNTER — Ambulatory Visit
Admission: RE | Admit: 2023-10-18 | Discharge: 2023-10-18 | Discharge status: Home / Self Care | Source: Ambulatory Visit | Attending: Family Medicine | Admitting: Family Medicine

## 2023-10-18 ENCOUNTER — Ambulatory Visit
Admission: RE | Admit: 2023-10-18 | Discharge: 2023-10-18 | Disposition: A | Discharge status: Home / Self Care | Source: Ambulatory Visit | Attending: Family Medicine | Admitting: Family Medicine

## 2023-10-18 DIAGNOSIS — R928 Other abnormal and inconclusive findings on diagnostic imaging of breast: Secondary | ICD-10-CM | POA: Insufficient documentation

## 2023-10-24 LAB — COLOGUARD: COLOGUARD: NEGATIVE

## 2024-01-22 ENCOUNTER — Inpatient Hospital Stay

## 2024-01-22 ENCOUNTER — Encounter: Payer: Self-pay | Admitting: Oncology

## 2024-01-22 ENCOUNTER — Inpatient Hospital Stay: Attending: Oncology | Admitting: Oncology

## 2024-01-22 VITALS — BP 142/80 | HR 78 | Temp 96.5°F | Resp 18 | Wt 208.4 lb

## 2024-01-22 DIAGNOSIS — Z148 Genetic carrier of other disease: Secondary | ICD-10-CM | POA: Diagnosis not present

## 2024-01-22 DIAGNOSIS — R778 Other specified abnormalities of plasma proteins: Secondary | ICD-10-CM | POA: Diagnosis not present

## 2024-01-22 DIAGNOSIS — Z803 Family history of malignant neoplasm of breast: Secondary | ICD-10-CM | POA: Diagnosis not present

## 2024-01-22 DIAGNOSIS — R0602 Shortness of breath: Secondary | ICD-10-CM | POA: Insufficient documentation

## 2024-01-22 DIAGNOSIS — M3500 Sicca syndrome, unspecified: Secondary | ICD-10-CM | POA: Insufficient documentation

## 2024-01-22 DIAGNOSIS — Z809 Family history of malignant neoplasm, unspecified: Secondary | ICD-10-CM | POA: Insufficient documentation

## 2024-01-22 LAB — CBC WITH DIFFERENTIAL/PLATELET
Abs Immature Granulocytes: 0.02 K/uL (ref 0.00–0.07)
Basophils Absolute: 0 K/uL (ref 0.0–0.1)
Basophils Relative: 1 %
Eosinophils Absolute: 0.1 K/uL (ref 0.0–0.5)
Eosinophils Relative: 2 %
HCT: 40.9 % (ref 36.0–46.0)
Hemoglobin: 14.2 g/dL (ref 12.0–15.0)
Immature Granulocytes: 0 %
Lymphocytes Relative: 20 %
Lymphs Abs: 1 K/uL (ref 0.7–4.0)
MCH: 31.8 pg (ref 26.0–34.0)
MCHC: 34.7 g/dL (ref 30.0–36.0)
MCV: 91.7 fL (ref 80.0–100.0)
Monocytes Absolute: 0.6 K/uL (ref 0.1–1.0)
Monocytes Relative: 12 %
Neutro Abs: 3.2 K/uL (ref 1.7–7.7)
Neutrophils Relative %: 65 %
Platelets: 233 K/uL (ref 150–400)
RBC: 4.46 MIL/uL (ref 3.87–5.11)
RDW: 13 % (ref 11.5–15.5)
WBC: 4.9 K/uL (ref 4.0–10.5)
nRBC: 0 % (ref 0.0–0.2)

## 2024-01-22 NOTE — Assessment & Plan Note (Signed)
 Seronegative, follow-up with rheumatology.

## 2024-01-22 NOTE — Assessment & Plan Note (Signed)
 Family history is strong for breast cancer.  Refer to genetic counselor

## 2024-01-22 NOTE — Progress Notes (Signed)
 Hematology/Oncology Consult note [phone] [phone]        REFERRING PROVIDER: Tobie Lady POUR, MD   CHIEF COMPLAINTS/REASON FOR VISIT:  Evaluation of abnormal SPEP   ASSESSMENT & PLAN:   Abnormal SPEP Progress protein electrophoresis results were reviewed and discussed with patient.  Minor protein band in lambda.  No measurable M protein. Recommend to check multiple myeloma panel, light chain ratio.  24-hour urine protein electrophoresis  Continue observation.  Carrier of alpha-1-antitrypsin deficiency Patient has intermittent shortness of breath since with exertion.  Previously evaluated with pulmonologist in [place]  7 to 8 years ago. Referred to pulmonology for further evaluation.  Sjogrens syndrome Seronegative, follow-up with rheumatology.  Family history of cancer Family history is strong for breast cancer.  Refer to genetic counselor   Orders Placed This Encounter  Procedures   CBC with Differential/Platelet    Standing Status:   Future    Number of Occurrences:   1    Expected Date:   01/22/2024    Expiration Date:   01/21/2025   Multiple Myeloma Panel (SPEP&IFE w/QIG)    Standing Status:   Future    Number of Occurrences:   1    Expected Date:   01/22/2024    Expiration Date:   01/21/2025   Kappa/lambda light chains    Standing Status:   Future    Number of Occurrences:   1    Expected Date:   01/22/2024    Expiration Date:   01/21/2025   IFE+PROTEIN ELECTRO, 24-HR UR    Standing Status:   Future    Expected Date:   01/22/2024    Expiration Date:   01/21/2025   Ambulatory referral to Pulmonology    Referral Priority:   Routine    Referral Type:   Consultation    Referral Reason:   Specialty Services Required    Requested Specialty:   Pulmonary Disease    Number of Visits Requested:   1   Ambulatory referral to Genetics    Referral Priority:   Routine    Referral Type:   Consultation    Referral Reason:   Specialty Services  Required    Number of Visits Requested:   1    All questions were answered. The patient knows to call the clinic with any problems, questions or concerns.  Zelphia Cap, MD, PhD [facility] Hematology Oncology 01/22/2024   HISTORY OF PRESENTING ILLNESS:   Susan Nelson is a  63 y.o.  female with PMH listed below was seen in consultation at the request of  Tobie Lady POUR, MD  for evaluation of abnormal SPEP.  Discussed the use of AI scribe software for clinical note transcription with the patient, who gave verbal consent to proceed.   She has a history of Sjogren's disease and fibromyalgia and is currently on Imuran,and Celebrex.    Protein electrophoresis done on 12/11/2023 as well as 08/09/2023 that showed a faint band noted in lambda, suggestive of free monoclonal protein.   M protein was negative x 2.  She has high normal hemoglobin levels on her CBC.  Patient denies history of sleep apnea.  Patient denies smoking.  She reports experiences shortness of breath and fatigue, particularly on days when her Sjogren's and fibromyalgia symptoms are exacerbated.  She has a family history of alpha-1 antitrypsin deficiency and is a carrier. There is also a family history of breast cancer on her mother's side, and she is up to date with her mammograms.  She has been on estrogen supplements since her total hysterectomy in 2009-2010 for postmenopausal symptoms.  She experiences easy bruising on her extremities, which she attributes to minor bumps. She has a history of fatty liver and has seen a liver specialist in the past. No recent imaging of her chest despite experiencing shortness of breath and occasional chest tightness.    MEDICAL HISTORY:  Past Medical History:  Diagnosis Date   Carrier of alpha-1-antitrypsin deficiency    GERD (gastroesophageal reflux disease)    IBS (irritable bowel syndrome)    Liver disease    Migraine    Sjogren's disease    Thyroid disease    Vitamin D deficiency      SURGICAL HISTORY: Past Surgical History:  Procedure Laterality Date   ABDOMINAL HYSTERECTOMY     BREAST BIOPSY Right 09/2007   benign w/ clip per pt   BREAST BIOPSY Right 11/01/2022   Stereo Bx, X-clip, path pending   BREAST BIOPSY Right 11/01/2022   MM RT BREAST BX W LOC DEV 1ST LESION IMAGE BX SPEC STEREO GUIDE 11/01/2022 [facility]   breasy biopsy     CATARACT EXTRACTION     CESAREAN SECTION     CHOLECYSTECTOMY     COLON SURGERY     LIVER BIOPSY      SOCIAL HISTORY: Social History   Socioeconomic History   Marital status: Married    Spouse name: Not on file   Number of children: Not on file   Years of education: Not on file   Highest education level: Not on file  Occupational History   Not on file  Tobacco Use   Smoking status: Never    Passive exposure: Never   Smokeless tobacco: Never  Substance and Sexual Activity   Alcohol use: Not on file   Drug use: Not on file   Sexual activity: Not on file  Other Topics Concern   Not on file  Social History Narrative   Not on file   Social Drivers of Health   Financial Resource Strain: Low Risk  (10/09/2023)   [facility]   Overall Financial Resource Strain (CARDIA)    Difficulty of Paying Living Expenses: Not hard at all  Food Insecurity: No Food Insecurity (10/09/2023)   [facility] from [facility]   Hunger Vital Sign    Within the past 12 months, you worried that your food would run out before you got the money to buy more.: Never true    Within the past 12 months, the food you bought just didn't last and you didn't have money to get more.: Never true  Transportation Needs: No Transportation Needs (10/09/2023)   Received from [facility] - Transportation    In the past 12 months, has lack of transportation kept you from medical appointments or from getting medications?: No    Lack of Transportation (Non-Medical): No  Physical  Activity: Not on file  Stress: Not on file  Social Connections: Not on file  Intimate Partner Violence: Not on file    FAMILY HISTORY: Family History  Problem Relation Age of Onset   Heart Problems Mother    Stroke Mother    Breast cancer Sister 76   Cancer Maternal Aunt    Cancer Maternal Aunt    Stroke Maternal Aunt    Anuerysm Maternal Aunt    Cancer Maternal Grandmother    Cancer Cousin    Cancer Cousin  ALLERGIES:  is allergic to pregabalin, hydroxychloroquine sulfate, and other.  MEDICATIONS:  Current Outpatient Medications  Medication Sig Dispense Refill   Artificial Saliva (BIOTENE DRY MOUTH MOISTURIZING) SOLN      ascorbic acid (VITAMIN C) 1000 MG tablet Take by mouth.     azaTHIOprine (IMURAN) 50 MG tablet Take 1 tablet by mouth 2 (two) times daily.     celecoxib (CELEBREX) 200 MG capsule      cyanocobalamin 1000 MCG tablet Take by mouth.     cycloSPORINE (RESTASIS) 0.05 % ophthalmic emulsion      DULoxetine (CYMBALTA) 60 MG capsule Take by mouth.     ergocalciferol (VITAMIN D2) 1.25 MG (50000 UT) capsule Take by mouth.     estradiol (ESTRACE) 1 MG tablet TAKE 1 TABLET BY MOUTH EVERY DAY NIGHTLY     Glucosamine-Chondroitin 1500-1200 MG/30ML LIQD Take by mouth.     levothyroxine (SYNTHROID) 112 MCG tablet Take by mouth.     liothyronine (CYTOMEL) 5 MCG tablet Take by mouth.     oxybutynin  (DITROPAN -XL) 10 MG 24 hr tablet Take 1 tablet (10 mg total) by mouth daily. 90 tablet 3   pantoprazole (PROTONIX) 40 MG tablet Take 1 tablet by mouth daily.     Prucalopride Succinate 2 MG TABS Take 2 mg by mouth.     rizatriptan (MAXALT) 10 MG tablet TAKE 1 TABLET BY MOUTH ONCE AS NEEDED FOR MIGRAINE. MAY TAKE ANOTHER TABLET AFTER 2 HOURS IF NEEDED     rosuvastatin (CRESTOR) 10 MG tablet      selenium 200 MCG TABS tablet Take by mouth.     Vitamin E 268 MG (400 UNIT) CAPS Take by mouth.     No current facility-administered medications for this visit.   Review of Systems   Constitutional:  Positive for fatigue. Negative for appetite change, chills and fever.  HENT:   Negative for hearing loss and voice change.        Dry mouth  Eyes:  Negative for eye problems.  Respiratory:  Negative for chest tightness and cough.   Cardiovascular:  Negative for chest pain.  Gastrointestinal:  Negative for abdominal distention, abdominal pain and blood in stool.  Endocrine: Negative for hot flashes.  Genitourinary:  Negative for difficulty urinating and frequency.   Musculoskeletal:  Negative for arthralgias.  Skin:  Negative for itching and rash.  Neurological:  Negative for extremity weakness.  Hematological:  Negative for adenopathy. Bruises/bleeds easily.  Psychiatric/Behavioral:  Negative for confusion.      PHYSICAL EXAMINATION:  Vitals:   01/22/24 1126  BP: (!) 142/80  Pulse: 78  Resp: 18  Temp: (!) 96.5 F (35.8 C)  SpO2: 94%   Filed Weights   01/22/24 1126  Weight: 208 lb 6.4 oz (94.5 kg)    Physical Exam Constitutional:      General: She is not in acute distress. HENT:     Head: Normocephalic and atraumatic.  Eyes:     General: No scleral icterus. Cardiovascular:     Rate and Rhythm: Normal rate and regular rhythm.     Heart sounds: Normal heart sounds.  Pulmonary:     Effort: Pulmonary effort is normal. No respiratory distress.     Breath sounds: No wheezing.  Abdominal:     General: Bowel sounds are normal. There is no distension.     Palpations: Abdomen is soft.  Musculoskeletal:        General: No deformity. Normal range of motion.     Cervical  back: Normal range of motion and neck supple.  Skin:    General: Skin is warm and dry.     Findings: No erythema or rash.  Neurological:     Mental Status: She is alert and oriented to person, place, and time. Mental status is at baseline.     Cranial Nerves: No cranial nerve deficit.  Psychiatric:        Mood and Affect: Mood normal.     LABORATORY DATA:  I have reviewed the data  as listed    Latest Ref Rng & Units 01/22/2024   12:20 PM  CBC  WBC 4.0 - 10.5 K/uL 4.9   Hemoglobin 12.0 - 15.0 g/dL 85.7   Hematocrit 63.9 - 46.0 % 40.9   Platelets 150 - 400 K/uL 233       Latest Ref Rng & Units 04/04/2021   10:28 AM  CMP  Creatinine 0.44 - 1.00 mg/dL 8.99       RADIOGRAPHIC STUDIES: I have personally reviewed the radiological images as listed and agreed with the findings in the report. No results found.

## 2024-01-22 NOTE — Assessment & Plan Note (Addendum)
 Progress protein electrophoresis results were reviewed and discussed with patient.  Minor protein band in lambda.  No measurable M protein. Recommend to check multiple myeloma panel, light chain ratio.  24-hour urine protein electrophoresis  Continue observation.

## 2024-01-22 NOTE — Assessment & Plan Note (Signed)
 Patient has intermittent shortness of breath since with exertion.  Previously evaluated with pulmonologist in [place]  7 to 8 years ago. Referred to pulmonology for further evaluation.

## 2024-01-23 LAB — KAPPA/LAMBDA LIGHT CHAINS
Kappa free light chain: 12.2 mg/L (ref 3.3–19.4)
Kappa, lambda light chain ratio: 1.53 (ref 0.26–1.65)
Lambda free light chains: 8 mg/L (ref 5.7–26.3)

## 2024-01-24 LAB — MULTIPLE MYELOMA PANEL, SERUM
Albumin SerPl Elph-Mcnc: 4 g/dL (ref 2.9–4.4)
Albumin/Glob SerPl: 1.9 — ABNORMAL HIGH (ref 0.7–1.7)
Alpha 1: 0.2 g/dL (ref 0.0–0.4)
Alpha2 Glob SerPl Elph-Mcnc: 0.6 g/dL (ref 0.4–1.0)
B-Globulin SerPl Elph-Mcnc: 1 g/dL (ref 0.7–1.3)
Gamma Glob SerPl Elph-Mcnc: 0.5 g/dL (ref 0.4–1.8)
Globulin, Total: 2.2 g/dL (ref 2.2–3.9)
IgA: 191 mg/dL (ref 87–352)
IgG (Immunoglobin G), Serum: 636 mg/dL (ref 586–1602)
IgM (Immunoglobulin M), Srm: 59 mg/dL (ref 26–217)
Total Protein ELP: 6.2 g/dL (ref 6.0–8.5)

## 2024-01-30 ENCOUNTER — Other Ambulatory Visit: Payer: Self-pay

## 2024-01-30 DIAGNOSIS — E8801 Alpha-1-antitrypsin deficiency: Secondary | ICD-10-CM | POA: Insufficient documentation

## 2024-01-30 DIAGNOSIS — Z803 Family history of malignant neoplasm of breast: Secondary | ICD-10-CM | POA: Diagnosis not present

## 2024-01-30 DIAGNOSIS — R0602 Shortness of breath: Secondary | ICD-10-CM | POA: Diagnosis not present

## 2024-01-30 DIAGNOSIS — Z808 Family history of malignant neoplasm of other organs or systems: Secondary | ICD-10-CM | POA: Diagnosis not present

## 2024-01-30 DIAGNOSIS — M3500 Sicca syndrome, unspecified: Secondary | ICD-10-CM | POA: Insufficient documentation

## 2024-01-30 DIAGNOSIS — R7689 Other specified abnormal immunological findings in serum: Secondary | ICD-10-CM | POA: Diagnosis present

## 2024-01-30 DIAGNOSIS — Z800 Family history of malignant neoplasm of digestive organs: Secondary | ICD-10-CM | POA: Diagnosis not present

## 2024-01-30 DIAGNOSIS — R778 Other specified abnormalities of plasma proteins: Secondary | ICD-10-CM

## 2024-02-04 ENCOUNTER — Ambulatory Visit: Payer: Self-pay | Admitting: Urology

## 2024-02-04 LAB — IFE+PROTEIN ELECTRO, 24-HR UR
% BETA, Urine: 0 %
ALPHA 1 URINE: 0 %
Albumin, U: 100 %
Alpha 2, Urine: 0 %
GAMMA GLOBULIN URINE: 0 %
Total Protein, Urine-Ur/day: 140 mg/(24.h) (ref 30–150)
Total Protein, Urine: 4.9 mg/dL
Total Volume: 2850

## 2024-02-05 ENCOUNTER — Inpatient Hospital Stay: Attending: Oncology | Admitting: Licensed Clinical Social Worker

## 2024-02-05 ENCOUNTER — Inpatient Hospital Stay

## 2024-02-05 ENCOUNTER — Encounter: Payer: Self-pay | Admitting: Licensed Clinical Social Worker

## 2024-02-05 ENCOUNTER — Other Ambulatory Visit: Payer: Self-pay | Admitting: Licensed Clinical Social Worker

## 2024-02-05 DIAGNOSIS — Z1379 Encounter for other screening for genetic and chromosomal anomalies: Secondary | ICD-10-CM

## 2024-02-05 DIAGNOSIS — Z800 Family history of malignant neoplasm of digestive organs: Secondary | ICD-10-CM

## 2024-02-05 DIAGNOSIS — Z803 Family history of malignant neoplasm of breast: Secondary | ICD-10-CM

## 2024-02-05 DIAGNOSIS — R7689 Other specified abnormal immunological findings in serum: Secondary | ICD-10-CM | POA: Diagnosis not present

## 2024-02-05 NOTE — Progress Notes (Signed)
 REFERRING PROVIDER: Babara Call, MD [address] [place],  [place] [address]  PRIMARY PROVIDER:  Sherial Bail, MD  PRIMARY REASON FOR VISIT:  1. Family history of breast cancer      HISTORY OF PRESENT ILLNESS:   Ms. Enyeart, a 63 y.o. female, was seen for a [facility] cancer genetics consultation at the request of Dr. Babara due to a family history of cancer.  Ms. Piscopo presents to clinic today to discuss the possibility of a hereditary predisposition to cancer, genetic testing, and to further clarify her future cancer risks, as well as potential cancer risks for family members.   CANCER HISTORY:  Ms. Kayes is a 63 y.o. female with no personal history of cancer.    RELEVANT MEDICAL HISTORY:  Menarche was at age 15.  First live birth at age 81.  Ovaries intact: no.  Hysterectomy: yes.  Menopausal status: postmenopausal.  HRT use: yes Colonoscopy: yes; normal. Mammogram within the last year: yes. Number of breast biopsies: 2.   Past Medical History:  Diagnosis Date   Carrier of alpha-1-antitrypsin deficiency    GERD (gastroesophageal reflux disease)    IBS (irritable bowel syndrome)    Liver disease    Migraine    Sjogren's disease    Thyroid disease    Vitamin D deficiency     Past Surgical History:  Procedure Laterality Date   ABDOMINAL HYSTERECTOMY     BREAST BIOPSY Right 09/2007   benign w/ clip per pt   BREAST BIOPSY Right 11/01/2022   Stereo Bx, X-clip, path pending   BREAST BIOPSY Right 11/01/2022   MM RT BREAST BX W LOC DEV 1ST LESION IMAGE BX SPEC STEREO GUIDE 11/01/2022 [facility]   breasy biopsy     CATARACT EXTRACTION     CESAREAN SECTION     CHOLECYSTECTOMY     COLON SURGERY     LIVER BIOPSY      FAMILY HISTORY:  We obtained a detailed, 4-generation family history.  Significant diagnoses are listed below: Family History  Problem Relation Age of Onset   Heart Problems Mother    Stroke Mother    Skin cancer Mother    Breast cancer  Sister 38   Pancreatic cancer Maternal Aunt        d.>50   Stroke Maternal Aunt    Anuerysm Maternal Aunt    Breast cancer Paternal Aunt    Throat cancer Maternal Grandmother    Breast cancer Cousin        d. 78s   Pancreatic cancer Cousin     Ms. Kilmartin has 1 son, 38 and 1 daughter, 54. She has 1 brother, 35 who has had skin cancer, and 1 sister, 46 who was diagnosed with breast cancer this year, no genetic testing.   Ms. Liberati mother is living at 23 and has had skin cancer (BCC). A maternal aunt had pancreatic cancer over age 4, and her daughter also had pancreatic cancer under age 25 and passed from it. Another maternal cousin had breast cancer. Another maternal cousin had thyroid cancer in her 94s. Maternal grandmother had throat cancer, she passed at 56.   Ms. Thal father passed at 53. A paternal aunt had breast cancer over age 38. No other known cancers on this side of the family.  Ms. Conyer is unaware of previous family history of genetic testing for hereditary cancer risks. There is no reported Ashkenazi Jewish ancestry. There is no known consanguinity.    GENETIC COUNSELING ASSESSMENT:  Ms. Loos is a 63 y.o. female with a family history of breast cancer and pancreatic cancer  which is somewhat suggestive of a hereditary cancer syndrome and predisposition to cancer. We, therefore, discussed and recommended the following at today's visit.   DISCUSSION: We discussed that, in general, most cancer is not inherited in families, but instead is sporadic or familial. Sporadic cancers occur by chance and typically happen at older ages (>50 years). Some families have more cancers than would be expected by chance; however, the ages or types of cancer are not consistent with a known genetic mutation or known genetic mutations have been ruled out. This type of familial cancer is thought to be due to a combination of multiple genetic, environmental, hormonal, and lifestyle factors.  While this combination of factors likely increases the risk of cancer, the exact source of this risk is not currently identifiable or testable.    We discussed that approximately 10% of breast cancer is hereditary. Most cases of hereditary breast/pancreatic cancer are associated with BRCA1/BRCA2 genes, although there are other genes associated with hereditary cancer as well. Cancers and risks are gene specific. We discussed that testing is beneficial for several reasons including knowing about cancer risks, identifying potential screening and risk-reduction options that may be appropriate, and to understand if other family members could be at risk for cancer and allow them to undergo genetic testing.   We reviewed the characteristics, features and inheritance patterns of hereditary cancer syndromes. We also discussed genetic testing, including the appropriate family members to test, the process of testing, insurance coverage and turn-around-time for results. We discussed the implications of a negative, positive and/or variant of uncertain significant result. We recommended Ms. Winnett pursue genetic testing for the [facility] CancerNext-Expanded+RNA gene panel.   The CancerNext-Expanded gene panel offered by [facility] and includes sequencing, rearrangement, and RNA analysis for the following 77 genes: AIP, ALK, APC, ATM, AXIN2, BAP1, BARD1, BMPR1A, BRCA1, BRCA2, BRIP1, CDC73, CDH1, CDK4, CDKN1B, CDKN2A, CEBPA, CHEK2, CTNNA1, DDX41, DICER1, ETV6, FH, FLCN, GATA2, LZTR1, MAX, MBD4, MEN1, MET, MLH1, MSH2, MSH3, MSH6, MUTYH, NF1, NF2, NTHL1, PALB2, PHOX2B, PMS2, POT1, PRKAR1A, PTCH1, PTEN, RAD51C, RAD51D, RB1, RET, RPS20, RUNX1, SDHA, SDHAF2, SDHB, SDHC, SDHD, SMAD4, SMARCA4, SMARCB1, SMARCE1, STK11, SUFU, TMEM127, TP53, TSC1, TSC2, VHL, and WT1 (sequencing and deletion/duplication); EGFR, HOXB13, KIT, MITF, PDGFRA, POLD1, and POLE (sequencing only); EPCAM and GREM1 (deletion/duplication only).   Based on Ms.  Carbone's family history of cancer, she meets medical criteria for genetic testing. Though Ms. Hebel is not personally affected, there are no affected family members that are willing/able to undergo hereditary cancer testing.  Therefore, Ms. Mattsonis the most informative family member available. Despite that she meets criteria, she may still have an out of pocket cost.   We discussed that some people do not want to undergo genetic testing due to fear of genetic discrimination.  A federal law called the Genetic Information Non-Discrimination Act (GINA) of 2008 helps protect individuals against genetic discrimination based on their genetic test results.  It impacts both health insurance and employment.  For health insurance, it protects against increased premiums, being kicked off insurance or being forced to take a test in order to be insured.  For employment it protects against hiring, firing and promoting decisions based on genetic test results.  Health status due to a cancer diagnosis is not protected under GINA.  This law does not protect life insurance, disability insurance, or other types of insurance.   PLAN: After  considering the risks, benefits, and limitations, Ms. Karis provided informed consent to pursue genetic testing and the blood sample was sent to [facility] for analysis of the CancerNext-Expanded+RNA Panel. Results should be available within approximately 2-3 weeks' time, at which point they will be disclosed by telephone to Ms. Pavon, as will any additional recommendations warranted by these results. Ms. Gibler will receive a summary of her genetic counseling visit and a copy of her results once available. This information will also be available in Epic.   Ms. Leavens questions were answered to her satisfaction today. Our contact information was provided should additional questions or concerns arise. Thank you for the referral and allowing us  to share in the care of your  patient.   Dena Cary, MS, [facility] Genetic Counselor [email or web address] Phone: [phone]  I personally spent a total of 50 minutes in the care of the patient today including preparing to see the patient, counseling and educating, placing orders, and documenting clinical information in the EHR. Patient's husband was present for the visit.   _______________________________________________________________________ For Office Staff:  Number of people involved in session: 2 Was an Intern/ student involved with case: no

## 2024-02-06 LAB — GENETIC SCREENING ORDER

## 2024-02-14 ENCOUNTER — Telehealth: Payer: Self-pay | Admitting: Licensed Clinical Social Worker

## 2024-02-21 ENCOUNTER — Encounter: Payer: Self-pay | Admitting: Licensed Clinical Social Worker

## 2024-02-21 ENCOUNTER — Ambulatory Visit: Payer: Self-pay | Admitting: Licensed Clinical Social Worker

## 2024-02-21 DIAGNOSIS — Z1379 Encounter for other screening for genetic and chromosomal anomalies: Secondary | ICD-10-CM

## 2024-02-21 NOTE — Telephone Encounter (Signed)
 I contacted Ms. Overbay to discuss her genetic testing results. No pathogenic variants were identified in the 77 genes analyzed. Detailed clinic note to follow.   The test report has been scanned into EPIC and is located under the Molecular Pathology section of the Results Review tab.  A portion of the result report is included below for reference     Dena Cary, MS, [facility] Genetic Counselor [email or web address] Phone: [phone]

## 2024-02-21 NOTE — Progress Notes (Signed)
 HPI:   Susan Nelson was previously seen in the  Cancer Genetics clinic due to a family history of cancer and concerns regarding a hereditary predisposition to cancer. Please refer to our prior cancer genetics clinic note for more information regarding our discussion, assessment and recommendations, at the time. Susan Nelson recent genetic test results were disclosed to Susan Nelson, as were recommendations warranted by these results. These results and recommendations are discussed in more detail below.  CANCER HISTORY:  Oncology History   No history exists.    FAMILY HISTORY:  We obtained a detailed, 4-generation family history.  Significant diagnoses are listed below: Family History  Problem Relation Age of Onset   Heart Problems Mother    Stroke Mother    Skin cancer Mother    Breast cancer Sister 80   Pancreatic cancer Maternal Aunt        d.>50   Stroke Maternal Aunt    Anuerysm Maternal Aunt    Breast cancer Paternal Aunt    Throat cancer Maternal Grandmother    Breast cancer Cousin        d. 83s   Pancreatic cancer Cousin      Susan Nelson has 1 son, 43 and 1 daughter, 74. Susan Nelson has 1 brother, 74 who has had skin cancer, and 1 sister, 81 who was diagnosed with breast cancer this year, no genetic testing.    Susan Nelson mother is living at 78 and has had skin cancer (BCC). A maternal aunt had pancreatic cancer over age 15, and Susan Nelson daughter also had pancreatic cancer under age 10 and passed from it. Another maternal cousin had breast cancer. Another maternal cousin had thyroid cancer in Susan Nelson 51s. Maternal grandmother had throat cancer, Susan Nelson passed at 14.    Susan Nelson father passed at 14. A paternal aunt had breast cancer over age 56. No other known cancers on this side of the family.   Susan Nelson is unaware of previous family history of genetic testing for hereditary cancer risks. There is no reported Ashkenazi Jewish ancestry. There is no known consanguinity.       GENETIC TEST RESULTS:  The [facility] CancerNext-Expanded+RNA Panel found no pathogenic mutations.   The CancerNext-Expanded gene panel offered by [facility] and includes sequencing, rearrangement, and RNA analysis for the following 77 genes: AIP, ALK, APC, ATM, AXIN2, BAP1, BARD1, BMPR1A, BRCA1, BRCA2, BRIP1, CDC73, CDH1, CDK4, CDKN1B, CDKN2A, CEBPA, CHEK2, CTNNA1, DDX41, DICER1, ETV6, FH, FLCN, GATA2, LZTR1, MAX, MBD4, MEN1, MET, MLH1, MSH2, MSH3, MSH6, MUTYH, NF1, NF2, NTHL1, PALB2, PHOX2B, PMS2, POT1, PRKAR1A, PTCH1, PTEN, RAD51C, RAD51D, RB1, RET, RPS20, RUNX1, SDHA, SDHAF2, SDHB, SDHC, SDHD, SMAD4, SMARCA4, SMARCB1, SMARCE1, STK11, SUFU, TMEM127, TP53, TSC1, TSC2, VHL, and WT1 (sequencing and deletion/duplication); EGFR, HOXB13, KIT, MITF, PDGFRA, POLD1, and POLE (sequencing only); EPCAM and GREM1 (deletion/duplication only).   The test report has been scanned into EPIC and is located under the Molecular Pathology section of the Results Review tab.  A portion of the result report is included below for reference. Genetic testing reported out on 02/14/2024.    Even though a pathogenic variant was not identified, possible explanations for the cancer in the family may include: There may be no hereditary risk for cancer in the family. The cancers in Susan Nelson's family may be sporadic/familial or due to other genetic and environmental factors. There may be a gene mutation in one of these genes that current testing methods cannot detect but that chance is small. There could be  another gene that has not yet been discovered, or that we have not yet tested, that is responsible for the cancer diagnoses in the family.  It is also possible there is a hereditary cause for the cancer in the family that Susan Nelson did not inherit. Therefore, it is important to remain in touch with cancer genetics in the future so that we can continue to offer Susan Nelson the most up to date genetic testing.    ADDITIONAL GENETIC TESTING:  Susan Nelson genetic testing was fairly extensive.  If there are additional relevant genes identified to increase cancer risk that can be analyzed in the future, we would be happy to discuss and coordinate this testing at that time.    CANCER SCREENING RECOMMENDATIONS:  Susan Nelson test result is considered negative (normal).  This means that we have not identified a hereditary cause for Susan Nelson family history of cancer at this time.   An individual's cancer risk and medical management are not determined by genetic test results alone. Overall cancer risk assessment incorporates additional factors, including personal medical history, family history, and any available genetic information that may result in a personalized plan for cancer prevention and surveillance. Therefore, it is recommended Susan Nelson continue to follow the cancer management and screening guidelines provided by Susan Nelson primary healthcare provider.  RECOMMENDATIONS FOR FAMILY MEMBERS:   Since Susan Nelson did not inherit a identifiable mutation in a cancer predisposition gene included on this panel, Susan Nelson could not have inherited a known mutation from Susan Nelson in one of these genes. Individuals in this family might be at some increased risk of developing cancer, over the general population risk, due to the family history of cancer.  Individuals in the family should notify their providers of the family history of cancer. We recommend women in this family have a yearly mammogram beginning at age 17, or 67 years younger than the earliest onset of cancer, an annual clinical breast exam, and perform monthly breast self-exams.  Family members should have colonoscopies by at age 58, or earlier, as recommended by their providers. Other members of the family may still carry a pathogenic variant in one of these genes that Susan Nelson. [place] did not inherit. Based on the family history, we recommend Susan Nelson maternal relatives, including Susan Nelson  sister who had breast cancer,  have genetic counseling and testing. Susan Nelson. Heinrichs will let us  know if we can be of any assistance in coordinating genetic counseling and/or testing for this family member.     FOLLOW-UP:  Lastly, we discussed with Susan Nelson. Lorenzi that cancer genetics is a rapidly advancing field and it is possible that new genetic tests will be appropriate for Susan Nelson and/or Susan Nelson family members in the future. We encouraged Susan Nelson to remain in contact with cancer genetics on an annual basis so we can update Susan Nelson personal and family histories and let Susan Nelson know of advances in cancer genetics that may benefit this family.   Our contact number was provided. Susan Nelson. Erno questions were answered to Susan Nelson satisfaction, and Susan Nelson knows Susan Nelson is welcome to call us  at anytime with additional questions or concerns.    Dena Cary, Susan Nelson, [facility] Genetic Counselor [email or web address] Phone: [phone]'

## 2024-02-25 ENCOUNTER — Encounter: Payer: Self-pay | Admitting: Oncology

## 2024-02-25 ENCOUNTER — Inpatient Hospital Stay: Admitting: Oncology

## 2024-02-25 VITALS — BP 134/77 | HR 70 | Temp 96.7°F | Resp 16

## 2024-02-25 DIAGNOSIS — Z803 Family history of malignant neoplasm of breast: Secondary | ICD-10-CM | POA: Diagnosis not present

## 2024-02-25 DIAGNOSIS — R778 Other specified abnormalities of plasma proteins: Secondary | ICD-10-CM | POA: Diagnosis not present

## 2024-02-25 DIAGNOSIS — M3500 Sicca syndrome, unspecified: Secondary | ICD-10-CM

## 2024-02-25 DIAGNOSIS — Z148 Genetic carrier of other disease: Secondary | ICD-10-CM

## 2024-02-25 DIAGNOSIS — Z809 Family history of malignant neoplasm, unspecified: Secondary | ICD-10-CM

## 2024-02-25 DIAGNOSIS — R7689 Other specified abnormal immunological findings in serum: Secondary | ICD-10-CM | POA: Diagnosis not present

## 2024-02-25 NOTE — Progress Notes (Signed)
 Hematology/Oncology Consult note [phone] [phone]        REFERRING PROVIDER: Sherial Bail, MD   CHIEF COMPLAINTS/REASON FOR VISIT:  abnormal SPEP   ASSESSMENT & PLAN:   Sjogrens syndrome Seronegative, follow-up with rheumatology.  Abnormal SPEP protein electrophoresis results were reviewed and discussed with patient.   Minor proPlease mein band in lambda.  No measurable M protein. Normal light chain ratio.  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  Previously evaluated with pulmonologist in [place]  7 to 8 years ago. Referred to pulmonology for further evaluation.  Family history of cancer Genetic testing is negative   Orders Placed This Encounter  Procedures   CBC with Differential (Cancer Center Only)    Standing Status:   Future    Expected Date:   02/24/2025    Expiration Date:   05/25/2025   CMP (Cancer Center only)    Standing Status:   Future    Expected Date:   02/24/2025    Expiration Date:   05/25/2025   Multiple Myeloma Panel (SPEP&IFE w/QIG)    Standing Status:   Future    Expected Date:   02/24/2025    Expiration Date:   05/25/2025   Kappa/lambda light chains    Standing Status:   Future    Expected Date:   02/24/2025    Expiration Date:   05/25/2025   Follow up in 1 year All questions were answered. The patient knows to call the clinic with any problems, questions or concerns.  Zelphia Cap, MD, PhD [facility] Hematology Oncology 02/25/2024   HISTORY OF PRESENTING ILLNESS:   Susan Nelson is a  63 y.o.  female with PMH listed below was seen in consultation at the request of  Sherial Bail, MD  for evaluation of abnormal SPEP.  Discussed the use of AI scribe software for clinical note transcription with the patient, who gave verbal  consent to proceed.   She has a history of Sjogren's disease and fibromyalgia and is currently on Imuran,and Celebrex.    Protein electrophoresis done on 12/11/2023 as well as 08/09/2023 that showed a faint band noted in lambda, suggestive of free monoclonal protein.   M protein was negative x 2.  She has high normal hemoglobin levels on her CBC.  Patient denies history of sleep apnea.  Patient denies smoking.  She reports experiences shortness of breath and fatigue, particularly on days when her Sjogren's and fibromyalgia symptoms are exacerbated.  She has a family history of alpha-1 antitrypsin deficiency and is a carrier. There is also a family history of breast cancer on her mother's side, and she is up to date with her mammograms. She has been on estrogen supplements since her total hysterectomy in 2009-2010 for postmenopausal symptoms.  She experiences easy bruising on her extremities, which she attributes to minor bumps. She has a history of fatty liver and has seen a liver specialist in the past. No recent imaging of her chest despite experiencing shortness of breath and occasional chest tightness.  INTERVAL HISTORY Susan Nelson is a 63 y.o. female who has above history reviewed by me today presents for follow up visit for abnormal SPEP Patient presents to discuss results.  She has had genetic testing which is negative.  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    GERD (gastroesophageal reflux disease)    IBS (irritable bowel syndrome)    Liver disease    Migraine    Sjogren's disease    Thyroid disease    Vitamin D deficiency     SURGICAL HISTORY: Past Surgical History:  Procedure Laterality Date   ABDOMINAL HYSTERECTOMY     BREAST BIOPSY Right 09/2007   benign w/ clip per pt   BREAST BIOPSY Right 11/01/2022   Stereo Bx, X-clip, path pending   BREAST BIOPSY Right  11/01/2022   MM RT BREAST BX W LOC DEV 1ST LESION IMAGE BX SPEC STEREO GUIDE 11/01/2022 [facility]   breasy biopsy     CATARACT EXTRACTION     CESAREAN SECTION     CHOLECYSTECTOMY     COLON SURGERY     LIVER BIOPSY      SOCIAL HISTORY: Social History   Socioeconomic History   Marital status: Married    Spouse name: Not on file   Number of children: Not on file   Years of education: Not on file   Highest education level: Not on file  Occupational History   Not on file  Tobacco Use   Smoking status: Never    Passive exposure: Never   Smokeless tobacco: Never  Substance and Sexual Activity   Alcohol use: Not on file   Drug use: Not on file   Sexual activity: Not on file  Other Topics Concern   Not on file  Social History Narrative   Not on file   Social Drivers of Health   Financial Resource Strain: Low Risk  (01/23/2024)   Overall Financial Resource Strain (CARDIA)    Difficulty of Paying Living Expenses: Not very hard  Food Insecurity: No Food Insecurity (01/23/2024)   Hunger Vital Sign    Worried About Running Out of Food in the Last Year: Never true    Ran Out of Food in the Last Year: Never true  Transportation Needs: No Transportation Needs (01/23/2024)   PRAPARE - Administrator, Civil Service (Medical): No    Lack of Transportation (Non-Medical): No  Physical Activity: Not on file  Stress: No Stress Concern Present (01/23/2024)   [employer] of Occupational Health - Occupational Stress Questionnaire    Feeling of Stress: Not at all  Social Connections: Not on file  Intimate Partner Violence: Not At Risk (01/23/2024)   Humiliation, Afraid, Rape, and Kick questionnaire    Fear of Current or Ex-Partner: No    Emotionally Abused: No    Physically Abused: No    Sexually Abused: No    FAMILY HISTORY: Family History  Problem Relation Age of Onset   Heart Problems Mother    Stroke Mother    Skin cancer Mother    Breast cancer Sister 75    Pancreatic cancer Maternal Aunt        d.>50   Stroke Maternal Aunt    Anuerysm Maternal Aunt    Breast cancer Paternal Aunt    Throat cancer Maternal Grandmother    Breast cancer Cousin        d. 65s   Pancreatic cancer Cousin     ALLERGIES:  is allergic to pregabalin, hydroxychloroquine sulfate, and other.  MEDICATIONS:  Current Outpatient Medications  Medication Sig Dispense Refill   Artificial Saliva (BIOTENE DRY MOUTH MOISTURIZING) SOLN      ascorbic acid (VITAMIN C) 1000 MG tablet Take by mouth.     azaTHIOprine (  IMURAN) 50 MG tablet Take 1 tablet by mouth 2 (two) times daily.     celecoxib (CELEBREX) 200 MG capsule      cyanocobalamin 1000 MCG tablet Take by mouth.     cycloSPORINE (RESTASIS) 0.05 % ophthalmic emulsion      DULoxetine (CYMBALTA) 60 MG capsule Take by mouth.     ergocalciferol (VITAMIN D2) 1.25 MG (50000 UT) capsule Take by mouth.     estradiol (ESTRACE) 1 MG tablet TAKE 1 TABLET BY MOUTH EVERY DAY NIGHTLY     Glucosamine-Chondroitin 1500-1200 MG/30ML LIQD Take by mouth.     levothyroxine (SYNTHROID) 112 MCG tablet Take by mouth.     liothyronine (CYTOMEL) 5 MCG tablet Take by mouth.     oxybutynin  (DITROPAN -XL) 10 MG 24 hr tablet Take 1 tablet (10 mg total) by mouth daily. 90 tablet 3   pantoprazole (PROTONIX) 40 MG tablet Take 1 tablet by mouth daily.     Prucalopride Succinate 2 MG TABS Take 2 mg by mouth.     rizatriptan (MAXALT) 10 MG tablet TAKE 1 TABLET BY MOUTH ONCE AS NEEDED FOR MIGRAINE. MAY TAKE ANOTHER TABLET AFTER 2 HOURS IF NEEDED     rosuvastatin (CRESTOR) 10 MG tablet      selenium 200 MCG TABS tablet Take by mouth.     Vitamin E 268 MG (400 UNIT) CAPS Take by mouth.     No current facility-administered medications for this visit.   Review of Systems  Constitutional:  Positive for fatigue. Negative for appetite change, chills and fever.  HENT:   Negative for hearing loss and voice change.        Dry mouth  Eyes:  Negative for eye  problems.  Respiratory:  Negative for chest tightness and cough.   Cardiovascular:  Negative for chest pain.  Gastrointestinal:  Negative for abdominal distention, abdominal pain and blood in stool.  Endocrine: Negative for hot flashes.  Genitourinary:  Negative for difficulty urinating and frequency.   Musculoskeletal:  Negative for arthralgias.  Skin:  Negative for itching and rash.  Neurological:  Negative for extremity weakness.  Hematological:  Negative for adenopathy. Bruises/bleeds easily.  Psychiatric/Behavioral:  Negative for confusion.      PHYSICAL EXAMINATION:  Vitals:   02/25/24 1301 02/25/24 1304  BP: (!) 143/81 134/77  Pulse: 70   Resp: 16   Temp: (!) 96.7 F (35.9 C)   SpO2: 97%    There were no vitals filed for this visit.   Physical Exam Constitutional:      General: She is not in acute distress. HENT:     Head: Normocephalic and atraumatic.  Eyes:     General: No scleral icterus. Cardiovascular:     Rate and Rhythm: Normal rate and regular rhythm.     Heart sounds: Normal heart sounds.  Pulmonary:     Effort: Pulmonary effort is normal. No respiratory distress.     Breath sounds: No wheezing.  Abdominal:     General: Bowel sounds are normal. There is no distension.     Palpations: Abdomen is soft.  Musculoskeletal:        General: No deformity. Normal range of motion.     Cervical back: Normal range of motion and neck supple.  Skin:    General: Skin is warm and dry.     Findings: No erythema or rash.  Neurological:     Mental Status: She is alert and oriented to person, place, and time. Mental status is at  baseline.     Cranial Nerves: No cranial nerve deficit.  Psychiatric:        Mood and Affect: Mood normal.     LABORATORY DATA:  I have reviewed the data as listed    Latest Ref Rng & Units 01/22/2024   12:20 PM  CBC  WBC 4.0 - 10.5 K/uL 4.9   Hemoglobin 12.0 - 15.0 g/dL 85.7   Hematocrit 63.9 - 46.0 % 40.9   Platelets 150 - 400  K/uL 233       Latest Ref Rng & Units 04/04/2021   10:28 AM  CMP  Creatinine 0.44 - 1.00 mg/dL 8.99       RADIOGRAPHIC STUDIES: I have personally reviewed the radiological images as listed and agreed with the findings in the report. No results found.

## 2024-02-25 NOTE — Assessment & Plan Note (Signed)
 Patient has intermittent shortness of breath since with exertion.  Previously evaluated with pulmonologist in [place]  7 to 8 years ago. Referred to pulmonology for further evaluation.

## 2024-02-25 NOTE — Assessment & Plan Note (Signed)
Genetic testing is negative.  

## 2024-02-25 NOTE — Assessment & Plan Note (Signed)
 protein electrophoresis results were reviewed and discussed with patient.   Minor proPlease mein band in lambda.  No measurable M protein. Normal light chain ratio.  24-hour urine protein electrophoresis showed no M protein  Continue observation and repeat level in 12 months

## 2024-02-25 NOTE — Assessment & Plan Note (Signed)
 Seronegative, follow-up with rheumatology.

## 2024-02-28 ENCOUNTER — Other Ambulatory Visit
Admission: RE | Admit: 2024-02-28 | Discharge: 2024-02-28 | Disposition: A | Discharge status: Home / Self Care | Source: Home / Self Care | Attending: Student in an Organized Health Care Education/Training Program | Admitting: Student in an Organized Health Care Education/Training Program

## 2024-02-28 ENCOUNTER — Encounter: Payer: Self-pay | Admitting: Student in an Organized Health Care Education/Training Program

## 2024-02-28 ENCOUNTER — Other Ambulatory Visit: Payer: Self-pay

## 2024-02-28 ENCOUNTER — Ambulatory Visit (INDEPENDENT_AMBULATORY_CARE_PROVIDER_SITE_OTHER): Admitting: Student in an Organized Health Care Education/Training Program

## 2024-02-28 ENCOUNTER — Ambulatory Visit
Admission: RE | Admit: 2024-02-28 | Discharge: 2024-02-28 | Disposition: A | Discharge status: Home / Self Care | Attending: Student in an Organized Health Care Education/Training Program | Admitting: Student in an Organized Health Care Education/Training Program

## 2024-02-28 VITALS — BP 128/78 | HR 64 | Temp 98.0°F | Ht 67.0 in | Wt 203.4 lb

## 2024-02-28 DIAGNOSIS — E8801 Alpha-1-antitrypsin deficiency: Secondary | ICD-10-CM | POA: Diagnosis present

## 2024-02-28 DIAGNOSIS — R0602 Shortness of breath: Secondary | ICD-10-CM | POA: Diagnosis not present

## 2024-02-28 DIAGNOSIS — Z803 Family history of malignant neoplasm of breast: Secondary | ICD-10-CM

## 2024-02-28 DIAGNOSIS — M3500 Sicca syndrome, unspecified: Secondary | ICD-10-CM

## 2024-02-28 DIAGNOSIS — Z148 Genetic carrier of other disease: Secondary | ICD-10-CM

## 2024-02-28 NOTE — Patient Instructions (Addendum)
  VISIT SUMMARY: Today, you were seen for your shortness of breath and to evaluate your alpha-1 antitrypsin carrier status. We discussed your symptoms, including your occasional cough and your history of Sjogren's syndrome and non-alcoholic fatty liver disease. We also reviewed your family history of alpha-1 antitrypsin deficiency and its potential impact on your health.  YOUR PLAN: -SHORTNESS OF BREATH WITH EXERTION: Your shortness of breath, especially during activities like climbing stairs, may be related to your Sjogren's syndrome. We will conduct a pulmonary function test to check your lung function and compare it with previous results. Additionally, an echocardiogram will be done to evaluate your heart pressures, and a CT scan of your lungs will be performed to look for any structural damage.  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 Although your lung function is currently normal, we will repeat your alpha-1 antitrypsin level and continue to monitor your lung function with annual tests. We discussed the potential for supplementation if your lung function declines.  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 Given your family history, it is important to monitor your liver health closely. We will refer you to the [facility] for specialist evaluation and management, and order repeat liver function tests if they have not been done recently.  INSTRUCTIONS: Please schedule the pulmonary function test, echocardiogram, and CT scan as soon as possible. Follow up with the [facility] for a specialist evaluation of your liver health. Ensure that your liver function  tests are up to date. Continue to monitor your symptoms and maintain regular follow-ups with your primary care doctor.     [facility] # : [phone]

## 2024-02-28 NOTE — Progress Notes (Signed)
 Assessment & Plan:   #Alpha-1-antitrypsin deficiency #Shortness of breath #Sjogren's Syndrome  She experiences intermittent shortness of breath with significant exertion.  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  Her most recent value was in 2020 at 71.  She also has underlying Sjogren's syndrome and currently treated.  Differential diagnosis for her symptoms includes an obstructive lung process secondary to a 4 antitrypsin deficiency as well as pulmonary hypertension due to her autoimmune disease.  Will initiate the workup by obtaining a pulmonary function test to assess her lung function and trend her spirometry values.  Will also obtain an echocardiogram to assess for RV pressure overload as well as a CT scan of the chest to evaluate for any underlying interstitial lung disease.  As to her mild alpha-1 antitrypsin deficiency carrier status, will obtain repeat alpha-1 levels today.  Have also discussed with her that given her family history of alpha-1 deficiency, strong family history of liver cirrhosis and her history of NAFLD she would benefit from a referral for evaluation by hepatologist.  Discussed potential for alpha-1 augmentation if lung function declines due to deficiency.  - Alpha-1-antitrypsin; Future - Pulmonary Function Test; Future - ECHOCARDIOGRAM COMPLETE; Future - CT CHEST HIGH RESOLUTION; Future - Ambulatory referral to Gastroenterology/Hepatology   Return in about 3 months (around 05/30/2024).  Susan November, MD [facility]  I spent 60 minutes caring for this patient today, including preparing to see the patient, obtaining a medical history , reviewing a separately obtained history, performing a medically appropriate examination and/or evaluation, counseling and educating the patient/family/caregiver, ordering medications, tests, or procedures,  documenting clinical information in the electronic health record, and independently interpreting results (not separately reported/billed) and communicating results to the patient/family/caregiver  End of visit medications:  No orders of the defined types were placed in this encounter.    Current Outpatient Medications:    Artificial Saliva (BIOTENE DRY MOUTH MOISTURIZING) SOLN, , Disp: , Rfl:    ascorbic acid (VITAMIN C) 1000 MG tablet, Take by mouth., Disp: , Rfl:    azaTHIOprine (IMURAN) 50 MG tablet, Take 1 tablet by mouth 2 (two) times daily., Disp: , Rfl:    celecoxib (CELEBREX) 200 MG capsule, , Disp: , Rfl:    cyanocobalamin 1000 MCG tablet, Take by mouth., Disp: , Rfl:    cycloSPORINE (RESTASIS) 0.05 % ophthalmic emulsion, , Disp: , Rfl:    DULoxetine (CYMBALTA) 60 MG capsule, Take by mouth., Disp: , Rfl:    ergocalciferol (VITAMIN D2) 1.25 MG (50000 UT) capsule, Take by mouth., Disp: , Rfl:    estradiol (ESTRACE) 1 MG tablet, TAKE 1 TABLET BY MOUTH EVERY DAY NIGHTLY, Disp: , Rfl:    Glucosamine-Chondroitin 1500-1200 MG/30ML LIQD, Take by mouth., Disp: , Rfl:    levothyroxine (SYNTHROID) 112 MCG tablet, Take by mouth., Disp: , Rfl:    liothyronine (CYTOMEL) 5 MCG tablet, Take by mouth., Disp: , Rfl:    oxybutynin  (DITROPAN -XL) 10 MG 24 hr tablet, Take 1 tablet (10 mg total) by mouth daily., Disp: 90 tablet, Rfl: 3   pantoprazole (PROTONIX) 40 MG tablet, Take 1 tablet by mouth daily., Disp: , Rfl:    Prucalopride Succinate 2 MG TABS, Take 2 mg by mouth., Disp: , Rfl:    rizatriptan (MAXALT) 10 MG tablet, TAKE 1 TABLET BY MOUTH ONCE AS NEEDED FOR MIGRAINE. MAY TAKE ANOTHER TABLET AFTER 2 HOURS  IF NEEDED, Disp: , Rfl:    rosuvastatin (CRESTOR) 10 MG tablet, , Disp: , Rfl:    selenium 200 MCG TABS tablet, Take by mouth., Disp: , Rfl:    Vitamin E 268 MG (400 UNIT) CAPS, Take by mouth., Disp: , Rfl:    Subjective:   PATIENT ID: Susan Nelson GENDER: female DOB: Nov 02, 1960, MRN:  [id number]  Chief Complaint  Patient presents with   Shortness of Breath    DOE. No wheezing. Occasional cough.      HPI  Discussed the use of AI scribe software for clinical note transcription with the patient, who gave verbal consent to proceed.  Susan Nelson is a 63 year old female with alpha 1 antitrypsin carrier status who presents with shortness of breath.  She experiences shortness of breath, particularly during significant exertion such as climbing stairs. She remains active and denies being sedentary. Occasional coughs occur, which she attributes to her Sjogren's syndrome, noting that the cough is more pronounced when she is run down. No history of smoking and minimal exposure to secondhand smoke for about three to four years.  She has a known history of alpha 1 antitrypsin carrier status, identified in 2020, with previous normal breathing tests conducted in 2018. Family history is significant for alpha 1 antitrypsin deficiency, with her father having COPD and liver cirrhosis, and her aunt also having liver cirrhosis. Her sister also carries the Z allele, while her brother has not been tested for alpha 1 antitrypsin deficiency.  Patient does bring in workup performed previously in regards to her alpha-1.  She has had alpha-1 levels measured as well as her phenotype theft.  Phenotype was noted to be MZ in 2020.  Alpha-1 levels: 07/2007: 103 mg/dL 06/7986: 94 mg/dL 0/7981: 92 mg/dL, identified as heterozygous for Z allele 05/2018: 90 mg/dL 07/7977: 88 mg/dL, identified as MZ   PFT 03/2017: FEV1 3.14 L (112% predicted), FVC 3.76 L (108% predicted) FEV1/FVC 0.83  She has Sjogren's syndrome, currently managed with Imuran, having previously been on hydroxychloroquine. She feels more stable and is experiencing fewer 'down days'.  She has a history of non-alcoholic fatty liver disease, with previous liver tests and elastography conducted. She reports a dull ache on her right side,  which she associates with her liver condition. She has seen a liver specialist in the past but is not currently under her care.   She denies any history of smoking or occupational exposures.  Ancillary information including prior medications, full medical/surgical/family/social histories, and PFTs (when available) are listed below and have been reviewed.    Review of Systems  Constitutional:  Negative for chills, fever and weight loss.  Respiratory:  Positive for shortness of breath. Negative for cough, hemoptysis, sputum production and wheezing.   Cardiovascular:  Negative for chest pain.     Objective:   Vitals:   02/28/24 1433  BP: 128/78  Pulse: 64  Temp: 98 F (36.7 C)  SpO2: 93%  Weight: 203 lb 6.4 oz (92.3 kg)  Height: 5' 7 (1.702 m)   93% on RA  BMI Readings from Last 3 Encounters:  02/28/24 31.86 kg/m  01/22/24 32.64 kg/m  02/05/23 34.46 kg/m   Wt Readings from Last 3 Encounters:  02/28/24 203 lb 6.4 oz (92.3 kg)  01/22/24 208 lb 6.4 oz (94.5 kg)  02/05/23 220 lb (99.8 kg)    Physical Exam Constitutional:      Appearance: Normal appearance.  Cardiovascular:     Rate and Rhythm: Normal rate  and regular rhythm.     Pulses: Normal pulses.     Heart sounds: Normal heart sounds.  Pulmonary:     Effort: Pulmonary effort is normal. No respiratory distress.     Breath sounds: Normal breath sounds. No wheezing or rales.  Neurological:     General: No focal deficit present.     Mental Status: She is alert and oriented to person, place, and time. Mental status is at baseline.       Ancillary Information    Past Medical History:  Diagnosis Date   Carrier of alpha-1-antitrypsin deficiency    GERD (gastroesophageal reflux disease)    IBS (irritable bowel syndrome)    Liver disease    Migraine    Sjogren's disease    Thyroid disease    Vitamin D deficiency      Family History  Problem Relation Age of Onset   Heart Problems Mother    Stroke  Mother    Skin cancer Mother    Breast cancer Sister 58   Pancreatic cancer Maternal Aunt        d.>50   Stroke Maternal Aunt    Anuerysm Maternal Aunt    Breast cancer Paternal Aunt    Throat cancer Maternal Grandmother    Breast cancer Cousin        d. 33s   Pancreatic cancer Cousin      Past Surgical History:  Procedure Laterality Date   ABDOMINAL HYSTERECTOMY     BREAST BIOPSY Right 09/2007   benign w/ clip per pt   BREAST BIOPSY Right 11/01/2022   Stereo Bx, X-clip, path pending   BREAST BIOPSY Right 11/01/2022   MM RT BREAST BX W LOC DEV 1ST LESION IMAGE BX SPEC STEREO GUIDE 11/01/2022 [facility]   breasy biopsy     CATARACT EXTRACTION     CESAREAN SECTION     CHOLECYSTECTOMY     COLON SURGERY     LIVER BIOPSY      Social History   Socioeconomic History   Marital status: Married    Spouse name: Not on file   Number of children: Not on file   Years of education: Not on file   Highest education level: Not on file  Occupational History   Not on file  Tobacco Use   Smoking status: Never    Passive exposure: Never   Smokeless tobacco: Never  Substance and Sexual Activity   Alcohol use: Not on file   Drug use: Not on file   Sexual activity: Not on file  Other Topics Concern   Not on file  Social History Narrative   Not on file   Social Drivers of Health   Financial Resource Strain: Low Risk  (01/23/2024)   Overall Financial Resource Strain (CARDIA)    Difficulty of Paying Living Expenses: Not very hard  Food Insecurity: No Food Insecurity (01/23/2024)   Hunger Vital Sign    Worried About Running Out of Food in the Last Year: Never true    Ran Out of Food in the Last Year: Never true  Transportation Needs: No Transportation Needs (01/23/2024)   PRAPARE - Administrator, Civil Service (Medical): No    Lack of Transportation (Non-Medical): No  Physical Activity: Not on file  Stress: No Stress Concern Present (01/23/2024)   [employer]  of Occupational Health - Occupational Stress Questionnaire    Feeling of Stress: Not at all  Social Connections: Not on file  Intimate Partner  Violence: Not At Risk (01/23/2024)   Humiliation, Afraid, Rape, and Kick questionnaire    Fear of Current or Ex-Partner: No    Emotionally Abused: No    Physically Abused: No    Sexually Abused: No     Allergies  Allergen Reactions   Pregabalin     Other reaction(s): Headache   Hydroxychloroquine Sulfate Rash   Other Rash     CBC    Component Value Date/Time   WBC 4.9 01/22/2024 1220   RBC 4.46 01/22/2024 1220   HGB 14.2 01/22/2024 1220   HCT 40.9 01/22/2024 1220   PLT 233 01/22/2024 1220   MCV 91.7 01/22/2024 1220   MCH 31.8 01/22/2024 1220   MCHC 34.7 01/22/2024 1220   RDW 13.0 01/22/2024 1220   LYMPHSABS 1.0 01/22/2024 1220   MONOABS 0.6 01/22/2024 1220   EOSABS 0.1 01/22/2024 1220   BASOSABS 0.0 01/22/2024 1220    Pulmonary Functions Testing Results:     No data to display          Outpatient Medications Prior to Visit  Medication Sig Dispense Refill   Artificial Saliva (BIOTENE DRY MOUTH MOISTURIZING) SOLN      ascorbic acid (VITAMIN C) 1000 MG tablet Take by mouth.     azaTHIOprine (IMURAN) 50 MG tablet Take 1 tablet by mouth 2 (two) times daily.     celecoxib (CELEBREX) 200 MG capsule      cyanocobalamin 1000 MCG tablet Take by mouth.     cycloSPORINE (RESTASIS) 0.05 % ophthalmic emulsion      DULoxetine (CYMBALTA) 60 MG capsule Take by mouth.     ergocalciferol (VITAMIN D2) 1.25 MG (50000 UT) capsule Take by mouth.     estradiol (ESTRACE) 1 MG tablet TAKE 1 TABLET BY MOUTH EVERY DAY NIGHTLY     Glucosamine-Chondroitin 1500-1200 MG/30ML LIQD Take by mouth.     levothyroxine (SYNTHROID) 112 MCG tablet Take by mouth.     liothyronine (CYTOMEL) 5 MCG tablet Take by mouth.     oxybutynin  (DITROPAN -XL) 10 MG 24 hr tablet Take 1 tablet (10 mg total) by mouth daily. 90 tablet 3   pantoprazole (PROTONIX) 40 MG tablet  Take 1 tablet by mouth daily.     Prucalopride Succinate 2 MG TABS Take 2 mg by mouth.     rizatriptan (MAXALT) 10 MG tablet TAKE 1 TABLET BY MOUTH ONCE AS NEEDED FOR MIGRAINE. MAY TAKE ANOTHER TABLET AFTER 2 HOURS IF NEEDED     rosuvastatin (CRESTOR) 10 MG tablet      selenium 200 MCG TABS tablet Take by mouth.     Vitamin E 268 MG (400 UNIT) CAPS Take by mouth.     No facility-administered medications prior to visit.

## 2024-02-29 LAB — ALPHA-1-ANTITRYPSIN: A-1 Antitrypsin, Ser: 113 mg/dL (ref 101–187)

## 2024-03-03 ENCOUNTER — Ambulatory Visit: Payer: Self-pay | Admitting: Student in an Organized Health Care Education/Training Program

## 2024-03-05 ENCOUNTER — Ambulatory Visit
Admission: RE | Admit: 2024-03-05 | Discharge: 2024-03-05 | Disposition: A | Discharge status: Home / Self Care | Source: Ambulatory Visit | Attending: Student in an Organized Health Care Education/Training Program | Admitting: Student in an Organized Health Care Education/Training Program

## 2024-03-05 ENCOUNTER — Ambulatory Visit (INDEPENDENT_AMBULATORY_CARE_PROVIDER_SITE_OTHER)

## 2024-03-05 DIAGNOSIS — E8801 Alpha-1-antitrypsin deficiency: Secondary | ICD-10-CM | POA: Insufficient documentation

## 2024-03-05 DIAGNOSIS — R0602 Shortness of breath: Secondary | ICD-10-CM | POA: Insufficient documentation

## 2024-03-05 LAB — PULMONARY FUNCTION TEST
DL/VA % pred: 99 %
DL/VA: 4.1 ml/min/mmHg/L
DLCO unc % pred: 97 %
DLCO unc: 21.34 ml/min/mmHg
FEF 25-75 Post: 3.24 L/s
FEF 25-75 Pre: 4.07 L/s
FEF2575-%Change-Post: -20 %
FEF2575-%Pred-Post: 135 %
FEF2575-%Pred-Pre: 169 %
FEV1-%Change-Post: -2 %
FEV1-%Pred-Post: 104 %
FEV1-%Pred-Pre: 107 %
FEV1-Post: 2.89 L
FEV1-Pre: 2.97 L
FEV1FVC-%Change-Post: 0 %
FEV1FVC-%Pred-Pre: 109 %
FEV6-%Change-Post: -1 %
FEV6-%Pred-Post: 97 %
FEV6-%Pred-Pre: 99 %
FEV6-Post: 3.39 L
FEV6-Pre: 3.44 L
FEV6FVC-%Pred-Post: 103 %
FEV6FVC-%Pred-Pre: 103 %
FVC-%Change-Post: -3 %
FVC-%Pred-Post: 94 %
FVC-%Pred-Pre: 97 %
FVC-Post: 3.39 L
FVC-Pre: 3.5 L
Post FEV1/FVC ratio: 85 %
Post FEV6/FVC ratio: 100 %
Pre FEV1/FVC ratio: 85 %
Pre FEV6/FVC Ratio: 100 %
RV % pred: 117 %
RV: 2.58 L
TLC % pred: 106 %
TLC: 5.86 L

## 2024-03-05 NOTE — Patient Instructions (Signed)
 Full PFT completed today ? ?

## 2024-03-05 NOTE — Progress Notes (Signed)
 Full PFT completed today ? ?

## 2024-03-12 ENCOUNTER — Ambulatory Visit
Admission: RE | Admit: 2024-03-12 | Discharge: 2024-03-12 | Disposition: A | Discharge status: Home / Self Care | Source: Ambulatory Visit | Attending: Student in an Organized Health Care Education/Training Program | Admitting: Student in an Organized Health Care Education/Training Program

## 2024-03-12 DIAGNOSIS — E785 Hyperlipidemia, unspecified: Secondary | ICD-10-CM | POA: Insufficient documentation

## 2024-03-12 DIAGNOSIS — R0602 Shortness of breath: Secondary | ICD-10-CM | POA: Insufficient documentation

## 2024-03-12 DIAGNOSIS — Z809 Family history of malignant neoplasm, unspecified: Secondary | ICD-10-CM | POA: Diagnosis not present

## 2024-03-12 DIAGNOSIS — I77810 Thoracic aortic ectasia: Secondary | ICD-10-CM | POA: Insufficient documentation

## 2024-03-12 DIAGNOSIS — E8801 Alpha-1-antitrypsin deficiency: Secondary | ICD-10-CM | POA: Insufficient documentation

## 2024-03-12 DIAGNOSIS — I351 Nonrheumatic aortic (valve) insufficiency: Secondary | ICD-10-CM | POA: Diagnosis not present

## 2024-03-12 LAB — ECHOCARDIOGRAM COMPLETE
AR max vel: 3.17 cm2
AV Peak grad: 5.5 mmHg
Ao pk vel: 1.17 m/s
Area-P 1/2: 3.53 cm2
S' Lateral: 2.9 cm

## 2024-03-20 ENCOUNTER — Ambulatory Visit: Payer: Self-pay | Admitting: Student in an Organized Health Care Education/Training Program

## 2024-06-04 ENCOUNTER — Ambulatory Visit: Admitting: Student in an Organized Health Care Education/Training Program

## 2024-06-04 ENCOUNTER — Encounter: Payer: Self-pay | Admitting: Student in an Organized Health Care Education/Training Program

## 2024-06-04 VITALS — BP 122/80 | HR 66 | Temp 97.7°F | Ht 67.0 in | Wt 202.4 lb

## 2024-06-04 DIAGNOSIS — K219 Gastro-esophageal reflux disease without esophagitis: Secondary | ICD-10-CM | POA: Diagnosis not present

## 2024-06-04 DIAGNOSIS — R0602 Shortness of breath: Secondary | ICD-10-CM

## 2024-06-04 DIAGNOSIS — Z148 Genetic carrier of other disease: Secondary | ICD-10-CM | POA: Diagnosis not present

## 2024-06-04 NOTE — Patient Instructions (Addendum)
" °  VISIT SUMMARY: During your visit, we discussed your alpha-1 antitrypsin deficiency and gastroesophageal reflux disease. Your recent tests showed stable lung function and no significant lung disease. We also reviewed your current medications and dietary habits.  YOUR PLAN: -ALPHA-1 ANTITRYPSIN DEFICIENCY (CARRIER STATE): Alpha-1 antitrypsin deficiency is a genetic condition that can affect lung and liver health. Your lung function is stable, and there is no evidence of significant lung disease. It is important to avoid lung irritants and continue with annual pulmonary function tests to monitor your lung health. Follow up with gastroenterology for liver health monitoring.  -GASTROESOPHAGEAL REFLUX DISEASE: Gastroesophageal reflux disease (GERD) occurs when stomach acid frequently flows back into the tube connecting your mouth and stomach. This can cause a dry cough. We discussed dietary changes to reduce reflux triggers, such as avoiding coffee, chocolate, spicy foods, and greasy foods. You may also try over-the-counter Claritin to see if it helps with your cough. Continue with your current reflux management and monitor your symptoms.  INSTRUCTIONS: Please continue with your current medications and dietary modifications. Avoid lung irritants and follow up with annual pulmonary function tests. Monitor your symptoms and follow up with gastroenterology for liver health monitoring.    Contains text generated by [employer].   "

## 2024-06-04 NOTE — Progress Notes (Unsigned)
 " Assessment & Plan  #Alpha-1-antitrypsin deficiency (carrier state)    She experiences intermittent shortness of breath with significant exertion.  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 Most recent value was at 113 mg/dL which is reassuring. PFT's repeated in November of 2025 were also fully normal and re-assuring without any signs of obstructive lung disease. Lung function stability, and absence of emphysema, bronchiectasis, or fibrosis on her CT scan is reassuring. At this point, there is no role for testing besides regular PFT's to ensure stability in her lung function. She will continue to follow up with gastroenterology regarding her liver. Furthermore, she knows to avoid pulmonary irritants and pollutants.   - Pulmonary Function Test; in one year - Continue to follow up with hepatology  #Gastroesophageal reflux disease    She experiences an intermittent dry cough, possibly related to reflux. Current management includes dietary modifications. Potential dietary triggers and the role of reflux in the cough were discussed, with consideration of allergy-related postnasal drip as a contributing factor. Implement dietary changes to reduce reflux triggers, such as avoiding coffee, chocolate, spicy foods, and greasy foods. Consider a trial of over-the-counter Claritin to assess its impact on the cough. Continue current reflux management and monitor symptoms.   Return in about 1 year (around 06/04/2025).  [name], MD [facility]  I spent 30 minutes caring for this patient today, including preparing to see the patient, obtaining a medical history , reviewing a separately obtained history, performing a medically appropriate examination and/or evaluation, counseling and educating the patient/family/caregiver, ordering medications, tests, or procedures, documenting clinical  information in the electronic health record, and independently interpreting results (not separately reported/billed) and communicating results to the patient/family/caregiver  End of visit medications:  No orders of the defined types were placed in this encounter.   Current Medications[1]   Subjective:   PATIENT ID: Susan Nelson GENDER: female DOB: 1960/11/02, MRN: [id number]  Chief Complaint  Patient presents with   Medical Management of Chronic Issues    No breathing problems.     HPI  Discussed the use of AI scribe software for clinical note transcription with the patient, who gave verbal consent to proceed.  History of Present Illness Susan Nelson is a 64 year old female with alpha-1 antitrypsin deficiency who presents for follow-up.  Initial Visit 02/28/2024:  She experiences shortness of breath, particularly during significant exertion such as climbing stairs. She remains active and denies being sedentary. Occasional coughs occur, which she attributes to her Sjogren's syndrome, noting that the cough is more pronounced when she is run down. No history of smoking and minimal exposure to secondhand smoke for about three to four years.   She has a known history of alpha 1 antitrypsin carrier status, identified in 2020, with previous normal breathing tests conducted in 2018. Family history is significant for alpha 1 antitrypsin deficiency, with her father having COPD and liver cirrhosis, and her aunt also having liver cirrhosis. Her sister also carries the Z allele, while her brother has not been tested for alpha 1 antitrypsin deficiency.   Patient does bring in workup performed previously in regards to her alpha-1.  She has had alpha-1 levels measured as well as her phenotype theft.  Phenotype was noted to be MZ in 2020.  Return Visit 06/04/2024:  She has a history of alpha-1 antitrypsin deficiency and is here for follow-up. Her  recent pulmonary function tests and CT  scan were normal. She was evaluated by gastroenterology at [facility] liver clinic in December.  She experiences a dry cough, particularly when fatigued, without associated pain, wheezing, or sputum production. She can exert herself without significant limitation, though she feels some fatigue with strenuous activity.  Her family history is notable for her father, who developed COPD in his forties and worked in factories, and his sister, who also had respiratory issues. Both passed away in their seventies.  She is currently taking Imuran for Sjogren's syndrome and has a history of reflux, for which she takes an antidepressant. She has not made significant dietary changes but has reduced chocolate intake. She drinks coffee regularly.   Alpha-1 levels: 01/2024: 113 mg/dL 08/7988: 896 mg/dL 06/7986: 94 mg/dL 0/7981: 92 mg/dL, identified as heterozygous for Z allele 05/2018: 90 mg/dL 07/7977: 88 mg/dL, identified as MZ    PFT 03/2017: FEV1 3.14 L (112% predicted), FVC 3.76 L (108% predicted) FEV1/FVC 0.83  PFT 03/2024: FEV 2.97L (107% predicted), FVC 3.5L (97% predicted), FEV1/FVC 0.85, TLC 5.86L (106% predicted), DLCO 21.34 ml/min/mmHg (97% predicted).   She has Sjogren's syndrome, currently managed with Imuran, having previously been on hydroxychloroquine. She feels more stable and is experiencing fewer 'down days'.   She has a history of non-alcoholic fatty liver disease, with previous liver tests and elastography conducted. She reports a dull ache on her right side, which she associates with her liver condition. She has seen a liver specialist in the past but is not currently under her care.    She denies any history of smoking or occupational exposures.   Ancillary information including prior medications, full medical/surgical/family/social histories, and PFTs (when available) are listed below and have been reviewed.    Review of Systems  Constitutional:  Negative for chills, fever and weight  loss.  Respiratory:  Negative for cough, hemoptysis, sputum production, shortness of breath and wheezing.   Cardiovascular:  Negative for chest pain.     Objective:   Vitals:   06/04/24 1540  BP: 122/80  Pulse: 66  Temp: 97.7 F (36.5 C)  TempSrc: Temporal  SpO2: 96%  Weight: 202 lb 6.4 oz (91.8 kg)  Height: 5' 7 (1.702 m)   96% on RA  BMI Readings from Last 3 Encounters:  06/04/24 31.70 kg/m  03/05/24 31.70 kg/m  02/28/24 31.86 kg/m   Wt Readings from Last 3 Encounters:  06/04/24 202 lb 6.4 oz (91.8 kg)  03/05/24 202 lb 6.4 oz (91.8 kg)  02/28/24 203 lb 6.4 oz (92.3 kg)     Physical Exam Constitutional:      Appearance: Normal appearance.  Cardiovascular:     Rate and Rhythm: Normal rate and regular rhythm.     Pulses: Normal pulses.     Heart sounds: Normal heart sounds.  Pulmonary:     Effort: Pulmonary effort is normal. No respiratory distress.     Breath sounds: Normal breath sounds. No wheezing or rales.  Neurological:     General: No focal deficit present.     Mental Status: She is alert and oriented to person, place, and time. Mental status is at baseline.       Ancillary Information    Past Medical History:  Diagnosis Date   Carrier of alpha-1-antitrypsin deficiency    GERD (gastroesophageal reflux disease)    IBS (irritable bowel syndrome)    Liver disease    Migraine    Sjogren's disease    Thyroid disease  Vitamin D deficiency      Family History  Problem Relation Age of Onset   Heart Problems Mother    Stroke Mother    Skin cancer Mother    Breast cancer Sister 11   Pancreatic cancer Maternal Aunt        d.>50   Stroke Maternal Aunt    Anuerysm Maternal Aunt    Breast cancer Paternal Aunt    Throat cancer Maternal Grandmother    Breast cancer Cousin        d. 61s   Pancreatic cancer Cousin      Past Surgical History:  Procedure Laterality Date   ABDOMINAL HYSTERECTOMY     BREAST BIOPSY Right 09/2007   benign w/  clip per pt   BREAST BIOPSY Right 11/01/2022   Stereo Bx, X-clip, path pending   BREAST BIOPSY Right 11/01/2022   MM RT BREAST BX W LOC DEV 1ST LESION IMAGE BX SPEC STEREO GUIDE 11/01/2022 [facility]   breasy biopsy     CATARACT EXTRACTION     CESAREAN SECTION     CHOLECYSTECTOMY     COLON SURGERY     LIVER BIOPSY      Social History   Socioeconomic History   Marital status: Married    Spouse name: Not on file   Number of children: Not on file   Years of education: Not on file   Highest education level: Not on file  Occupational History   Not on file  Tobacco Use   Smoking status: Never    Passive exposure: Never   Smokeless tobacco: Never  Substance and Sexual Activity   Alcohol use: Not on file   Drug use: Not on file   Sexual activity: Not on file  Other Topics Concern   Not on file  Social History Narrative   Not on file   Social Drivers of Health   Tobacco Use: Low Risk (06/04/2024)   Patient History    Smoking Tobacco Use: Never    Smokeless Tobacco Use: Never    Passive Exposure: Never  Financial Resource Strain: Low Risk  (04/09/2024)   Received from [facility]   Overall Financial Resource Strain (CARDIA)    Difficulty of Paying Living Expenses: Not very hard  Food Insecurity: No Food Insecurity (04/09/2024)   Received from [facility]   Epic    Within the past 12 months, you worried that your food would run out before you got the money to buy more.: Never true    Within the past 12 months, the food you bought just didn't last and you didn't have money to get more.: Never true  Transportation Needs: No Transportation Needs (04/09/2024)   Received from [facility] - Transportation    In the past 12 months, has lack of transportation kept you from medical appointments or from getting medications?: No    Lack of Transportation (Non-Medical): No  Physical Activity: Not on file  Stress: No  Stress Concern Present (01/23/2024)   [employer] [facility] [employer] - Occupational Stress Questionnaire    Feeling of Stress: Not at all  Social Connections: Not on file  Intimate Partner Violence: Not At Risk (01/23/2024)   Epic    Fear of Current or Ex-Partner: No    Emotionally Abused: No    Physically Abused: No    Sexually Abused: No  Depression (PHQ2-9): Low Risk (02/25/2024)   Depression (PHQ2-9)  PHQ-2 Score: 0  Alcohol Screen: Not on file  Housing: Low Risk  (04/09/2024)   Received from [facility]   Epic    In the last 12 months, was there a time when you were not able to pay the mortgage or rent on time?: No    In the past 12 months, how many times have you moved where you were living?: 0    At any time in the past 12 months, were you homeless or living in a shelter (including now)?: No  Utilities: Not At Risk (04/09/2024)   Received from [facility]   Epic    In the past 12 months has the electric, gas, oil, or water company threatened to shut off services in your home?: No  Health Literacy: Adequate Health Literacy (01/23/2024)   B1300 Health Literacy    Frequency of need for help with medical instructions: Never     Allergies[2]   CBC    Component Value Date/Time   WBC 4.9 01/22/2024 1220   RBC 4.46 01/22/2024 1220   HGB 14.2 01/22/2024 1220   HCT 40.9 01/22/2024 1220   PLT 233 01/22/2024 1220   MCV 91.7 01/22/2024 1220   MCH 31.8 01/22/2024 1220   MCHC 34.7 01/22/2024 1220   RDW 13.0 01/22/2024 1220   LYMPHSABS 1.0 01/22/2024 1220   MONOABS 0.6 01/22/2024 1220   EOSABS 0.1 01/22/2024 1220   BASOSABS 0.0 01/22/2024 1220    Pulmonary Functions Testing Results:    Latest Ref Rng & Units 03/05/2024    3:42 PM  PFT Results  FVC-Pre L 3.50   FVC-Predicted Pre % 97   FVC-Post L 3.39   FVC-Predicted Post % 94   Pre FEV1/FVC % % 85   Post FEV1/FCV % % 85   FEV1-Pre L 2.97   FEV1-Predicted Pre % 107    FEV1-Post L 2.89   DLCO uncorrected ml/min/mmHg 21.34   DLCO UNC% % 97   DLVA Predicted % 99   TLC L 5.86   TLC % Predicted % 106   RV % Predicted % 117     Outpatient Medications Prior to Visit  Medication Sig Dispense Refill   Artificial Saliva (BIOTENE DRY MOUTH MOISTURIZING) SOLN      ascorbic acid (VITAMIN C) 1000 MG tablet Take by mouth.     azaTHIOprine (IMURAN) 50 MG tablet Take 1 tablet by mouth 2 (two) times daily.     celecoxib (CELEBREX) 200 MG capsule      cyanocobalamin 1000 MCG tablet Take by mouth.     cycloSPORINE (RESTASIS) 0.05 % ophthalmic emulsion      DULoxetine (CYMBALTA) 60 MG capsule Take by mouth.     ergocalciferol (VITAMIN D2) 1.25 MG (50000 UT) capsule Take by mouth.     estradiol (ESTRACE) 1 MG tablet TAKE 1 TABLET BY MOUTH EVERY DAY NIGHTLY     Glucosamine-Chondroitin 1500-1200 MG/30ML LIQD Take by mouth.     levothyroxine (SYNTHROID) 112 MCG tablet Take by mouth.     liothyronine (CYTOMEL) 5 MCG tablet Take by mouth.     oxybutynin  (DITROPAN -XL) 10 MG 24 hr tablet Take 1 tablet (10 mg total) by mouth daily. 90 tablet 3   pantoprazole (PROTONIX) 40 MG tablet Take 1 tablet by mouth daily.     Prucalopride Succinate 2 MG TABS Take 2 mg by mouth.     rizatriptan (MAXALT) 10 MG tablet TAKE 1 TABLET BY MOUTH ONCE AS NEEDED FOR MIGRAINE.  MAY TAKE ANOTHER TABLET AFTER 2 HOURS IF NEEDED     rosuvastatin (CRESTOR) 10 MG tablet      selenium 200 MCG TABS tablet Take by mouth.     Vitamin E 268 MG (400 UNIT) CAPS Take by mouth.     No facility-administered medications prior to visit.      [1]  Current Outpatient Medications:    Artificial Saliva (BIOTENE DRY MOUTH MOISTURIZING) SOLN, , Disp: , Rfl:    ascorbic acid (VITAMIN C) 1000 MG tablet, Take by mouth., Disp: , Rfl:    azaTHIOprine (IMURAN) 50 MG tablet, Take 1 tablet by mouth 2 (two) times daily., Disp: , Rfl:    celecoxib (CELEBREX) 200 MG capsule, , Disp: , Rfl:    cyanocobalamin 1000 MCG tablet,  Take by mouth., Disp: , Rfl:    cycloSPORINE (RESTASIS) 0.05 % ophthalmic emulsion, , Disp: , Rfl:    DULoxetine (CYMBALTA) 60 MG capsule, Take by mouth., Disp: , Rfl:    ergocalciferol (VITAMIN D2) 1.25 MG (50000 UT) capsule, Take by mouth., Disp: , Rfl:    estradiol (ESTRACE) 1 MG tablet, TAKE 1 TABLET BY MOUTH EVERY DAY NIGHTLY, Disp: , Rfl:    Glucosamine-Chondroitin 1500-1200 MG/30ML LIQD, Take by mouth., Disp: , Rfl:    levothyroxine (SYNTHROID) 112 MCG tablet, Take by mouth., Disp: , Rfl:    liothyronine (CYTOMEL) 5 MCG tablet, Take by mouth., Disp: , Rfl:    oxybutynin  (DITROPAN -XL) 10 MG 24 hr tablet, Take 1 tablet (10 mg total) by mouth daily., Disp: 90 tablet, Rfl: 3   pantoprazole (PROTONIX) 40 MG tablet, Take 1 tablet by mouth daily., Disp: , Rfl:    Prucalopride Succinate 2 MG TABS, Take 2 mg by mouth., Disp: , Rfl:    rizatriptan (MAXALT) 10 MG tablet, TAKE 1 TABLET BY MOUTH ONCE AS NEEDED FOR MIGRAINE. MAY TAKE ANOTHER TABLET AFTER 2 HOURS IF NEEDED, Disp: , Rfl:    rosuvastatin (CRESTOR) 10 MG tablet, , Disp: , Rfl:    selenium 200 MCG TABS tablet, Take by mouth., Disp: , Rfl:    Vitamin E 268 MG (400 UNIT) CAPS, Take by mouth., Disp: , Rfl:  [2]  Allergies Allergen Reactions   Pregabalin     Other reaction(s): Headache   Hydroxychloroquine Sulfate Rash   Other Rash   "

## 2025-02-16 ENCOUNTER — Inpatient Hospital Stay

## 2025-02-24 ENCOUNTER — Inpatient Hospital Stay: Admitting: Oncology
